# Patient Record
Sex: Male | Born: 2000 | Race: White | Hispanic: No | Marital: Single | Smoking: Never smoker
Health system: Southern US, Community
[De-identification: ages and names within clinical notes are randomized; demographics above are authoritative.]

## PROBLEM LIST (undated history)

## (undated) DIAGNOSIS — L309 Dermatitis, unspecified: Secondary | ICD-10-CM

## (undated) DIAGNOSIS — R569 Unspecified convulsions: Secondary | ICD-10-CM

## (undated) DIAGNOSIS — R5600 Simple febrile convulsions: Secondary | ICD-10-CM

## (undated) DIAGNOSIS — F909 Attention-deficit hyperactivity disorder, unspecified type: Secondary | ICD-10-CM

## (undated) DIAGNOSIS — J302 Other seasonal allergic rhinitis: Secondary | ICD-10-CM

## (undated) HISTORY — PX: OTHER SURGICAL HISTORY: SHX169

---
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
## 2022-07-28 ENCOUNTER — Other Ambulatory Visit (INDEPENDENT_AMBULATORY_CARE_PROVIDER_SITE_OTHER): Payer: Self-pay

## 2022-07-28 ENCOUNTER — Other Ambulatory Visit (HOSPITAL_BASED_OUTPATIENT_CLINIC_OR_DEPARTMENT_OTHER): Payer: Self-pay | Admitting: Family Medicine

## 2022-07-31 ENCOUNTER — Other Ambulatory Visit (HOSPITAL_COMMUNITY): Payer: Self-pay

## 2022-07-31 ENCOUNTER — Other Ambulatory Visit: Payer: Self-pay

## 2022-07-31 MED ORDER — TRIAMCINOLONE ACETONIDE 0.1 % EX CREA
1.0000 | TOPICAL_CREAM | Freq: Two times a day (BID) | CUTANEOUS | 1 refills | Status: DC
  Filled 2022-07-31 (×2): qty 30, 30d supply, fill #0
  Filled 2022-08-31: qty 30, 30d supply, fill #1

## 2022-08-08 ENCOUNTER — Ambulatory Visit (INDEPENDENT_AMBULATORY_CARE_PROVIDER_SITE_OTHER): Payer: BC Managed Care – PPO | Admitting: Family Medicine

## 2022-08-08 ENCOUNTER — Other Ambulatory Visit (HOSPITAL_COMMUNITY): Payer: Self-pay

## 2022-08-08 ENCOUNTER — Encounter (HOSPITAL_BASED_OUTPATIENT_CLINIC_OR_DEPARTMENT_OTHER): Payer: Self-pay | Admitting: Family Medicine

## 2022-08-08 ENCOUNTER — Other Ambulatory Visit: Payer: Self-pay

## 2022-08-08 VITALS — BP 138/81 | HR 89 | Ht 68.98 in | Wt 154.1 lb

## 2022-08-08 DIAGNOSIS — G40309 Generalized idiopathic epilepsy and epileptic syndromes, not intractable, without status epilepticus: Secondary | ICD-10-CM | POA: Diagnosis not present

## 2022-08-08 MED ORDER — CARBAMAZEPINE 200 MG PO TABS
200.0000 mg | ORAL_TABLET | Freq: Two times a day (BID) | ORAL | 0 refills | Status: DC
  Filled 2022-08-08: qty 60, 30d supply, fill #0

## 2022-08-08 NOTE — Assessment & Plan Note (Addendum)
Patient with ongoing seizure disorder, has been off of Tegretol 200 mg regularly since June 2023.  Witnessed seizure by father while at home on July 28, 2022, at that time parents discovered that patient has not been taking his Tegretol as prescribed.  Since this witnessed seizure he has been taking Tegretol 200 mg twice per day.  Ambulatory referral for neurology placed today, Tegretol 200 mg twice daily sent to pharmacy.  Referral specialist able to secure an appointment with [facility] neurology this Thursday, February 8 at 10:15 AM for further evaluation and treatment.  Patient and mother aware.  Follow-up with PCP in 3 months.

## 2022-08-08 NOTE — Progress Notes (Signed)
Established Patient Office Visit  Subjective   Patient ID: Matthew Duncan, male    DOB: [birth year]  Age: 22 y.o. MRN: [id number]  Chief Complaint  Patient presents with   Seizures    Pt here for having episodes of epilepsy, pt stopped taking his medication for having seizures need a renewal for Tegretol   Medication Refill    HPI  Seizure disorder was followed by Dr. Gaynell Face last in 2022 before he retired. They could never get an appointment with one of the partners in the practice, started coming to PCP for management. Patient got a job and wanted to focus on doing the job right and he stopped taking the tegretol regularly since June 2023 and January 2024.  Patient was telling parents he was taking his medications when he was not. Has had one seizure on 07/28/22 at home witnessed by father.  Since that time he has been taking Tegretol 200 mg BID currently. Has  not had any more seizures since being on medication regularly. Mother reports he did not go to hospital at that time as he recovered quickly.  Tolerates medication well without reported side effects.  Wants neurology referral today.  Discussed getting tegretol level today, has only been on medication since 07/28/22, will defer to neurology.   Review of Systems  Constitutional:  Negative for chills and fever.  Respiratory:  Negative for shortness of breath.   Cardiovascular:  Negative for chest pain.  Gastrointestinal:  Negative for abdominal pain, nausea and vomiting.  Neurological:  Negative for dizziness, weakness and headaches.      Objective:     BP 138/81 (BP Location: Right Arm, Patient Position: Sitting, Cuff Size: Normal)   Pulse 89   Ht 5' 8.98" (1.752 m)   Wt 154 lb 1.6 oz (69.9 kg)   SpO2 100%   BMI 22.77 kg/m  BP Readings from Last 3 Encounters:  08/08/22 138/81  11/04/21 116/74  05/25/21 126/70      Physical Exam Vitals and nursing note reviewed.  Constitutional:      General: He is not in acute  distress.    Appearance: Normal appearance.  Cardiovascular:     Rate and Rhythm: Normal rate and regular rhythm.     Heart sounds: Normal heart sounds.  Pulmonary:     Effort: Pulmonary effort is normal.     Breath sounds: Normal breath sounds.  Skin:    General: Skin is warm and dry.     Capillary Refill: Capillary refill takes less than 2 seconds.  Neurological:     General: No focal deficit present.     Mental Status: He is alert. Mental status is at baseline.  Psychiatric:        Mood and Affect: Mood normal.        Behavior: Behavior normal.        Thought Content: Thought content normal.        Judgment: Judgment normal.     No results found for any visits on 08/08/22.    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  Witnessed seizure by  father while at home on July 28, 2022, at that time parents discovered that patient has not been taking his Tegretol as prescribed.  Since this witnessed seizure he has been taking Tegretol 200 mg twice per day.  Ambulatory referral for neurology placed today, Tegretol 200 mg twice daily sent to pharmacy.  Referral specialist able to secure an appointment with [facility] neurology this Thursday, February 8 at 10:15 AM for further evaluation and treatment.  Patient and mother aware.  Follow-up with PCP in 3 months.      Relevant Medications   carbamazepine (TEGRETOL) 200 MG tablet   Other Relevant Orders   Ambulatory referral to Neurology  Agrees with plan of care discussed.  Questions answered.   Return in about 3 months (around 11/06/2022) for seizure disorder.    Chalmers Guest, FNP

## 2022-08-10 ENCOUNTER — Ambulatory Visit (INDEPENDENT_AMBULATORY_CARE_PROVIDER_SITE_OTHER): Payer: BC Managed Care – PPO | Admitting: Neurology

## 2022-08-10 ENCOUNTER — Other Ambulatory Visit (HOSPITAL_COMMUNITY): Payer: Self-pay

## 2022-08-10 ENCOUNTER — Other Ambulatory Visit: Payer: Self-pay

## 2022-08-10 ENCOUNTER — Encounter: Payer: Self-pay | Admitting: Neurology

## 2022-08-10 VITALS — BP 125/78 | HR 88 | Ht 68.58 in | Wt 155.0 lb

## 2022-08-10 DIAGNOSIS — G40309 Generalized idiopathic epilepsy and epileptic syndromes, not intractable, without status epilepticus: Secondary | ICD-10-CM

## 2022-08-10 DIAGNOSIS — Z5181 Encounter for therapeutic drug level monitoring: Secondary | ICD-10-CM | POA: Diagnosis not present

## 2022-08-10 MED ORDER — CARBAMAZEPINE 200 MG PO TABS
200.0000 mg | ORAL_TABLET | Freq: Two times a day (BID) | ORAL | 3 refills | Status: DC
  Filled 2022-08-10: qty 180, 90d supply, fill #0
  Filled 2022-09-25: qty 60, 30d supply, fill #0
  Filled 2022-10-02: qty 180, 90d supply, fill #0
  Filled 2022-10-02: qty 60, 30d supply, fill #0
  Filled 2022-10-03 – 2022-10-04 (×2): qty 180, 90d supply, fill #0

## 2022-08-10 NOTE — Progress Notes (Signed)
[facility]  PATIENT: Matthew Duncan DOB: [birth year]  REQUESTING CLINICIAN: Chalmers Guest, FNP HISTORY FROM: Patient and mother  REASON FOR VISIT: Epilepsy, here to establish care   HISTORICAL  CHIEF COMPLAINT:  Chief Complaint  Patient presents with   New Patient (Initial Visit)    Rm 13 with mom here for consult on seizures- Pt reports 07/28/2022 he had a seizure due to not taking his meds since June of 2023. He back on Tegretol 200 mg bid. Mom reports she would like discuss if this med dosage is appropriate.     HISTORY OF PRESENT ILLNESS:  This is a 22 year old gentleman with past medical history ADHD, seizure disorder who is presenting to establish care.  History mainly obtained by mother.  Per mom seizure started at the age of 45 days old.  She was nursing [name] and his whole body went limp, then he started having some shaking.  He was admitted to the hospital had extensive workup but they could not figure out why he was having seizures.  He was having seizures until 20-month-old then seizure got better and the meds were weaned at the age of 22.  At the age of 22 he did have a febrile seizure they did not restart him on medication but at the age of 22 he did have a seizure at school.  Then he started seeing Dr. Gaynell Face and put on Tegretol.  While he is on Tegretol he has been doing well but every time that he weaned the Tegretol he will have a breakthrough seizure.  This summer while going for summer camp he stopped the medication and did not tell his parents until January 26 when he did have a seizure in his sleep.  At that time, he was restarted back on Tegretol 200 mg twice daily.  With the Tegretol, he denies any side effect from the medication.  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    Handedness: Right   Onset: Since the age of 20 days old   Seizure Type: Generalized  convulsion  Current frequency: Last episode in Jan 26  Any injuries from seizures: Tongue biting   Seizure risk factors: None reported   Previous ASMs: Phenobarb, Tegetrol, Dilantin   Currenty ASMs: Carbamazepine 200 mg twice daily   ASMs side effects: Denies   Brain Images: reported as normal   Previous EEGs: Normal routine    OTHER MEDICAL CONDITIONS: ADHD   REVIEW OF SYSTEMS: Full 14 system review of systems performed and negative with exception of: As noted in the HPI   ALLERGIES: No Known Allergies  HOME MEDICATIONS: Outpatient Medications Prior to Visit  Medication Sig Dispense Refill   triamcinolone cream (KENALOG) 0.1 % Apply 1 Application topically 2 (two) times daily. 30 g 1   carbamazepine (TEGRETOL) 200 MG tablet Take 1 tablet (200 mg total) by mouth 2 (two) times daily. 60 tablet 0   No facility-administered medications prior to visit.    PAST MEDICAL HISTORY: Past Medical History:  Diagnosis Date   ADHD (attention deficit hyperactivity disorder)    Asthma    Eczema    Febrile seizure ([place])    Seasonal allergies    Seizure ([place])    Seizures ([place])     PAST SURGICAL HISTORY: Past Surgical History:  Procedure Laterality Date   DENTAL SURGERY  2010   OTHER SURGICAL HISTORY  2004  Broviac Catheter insertion and removal when he was an infant    FAMILY HISTORY: Family History  Problem Relation Age of Onset   Diabetes Maternal Grandfather    Heart Problems Maternal Grandfather        Heart Disease   Seizures Other        Maternal 2nd Cousin    SOCIAL HISTORY: Social History   Socioeconomic History   Marital status: Single    Spouse name: Not on file   Number of children: Not on file   Years of education: Not on file   Highest education level: Not on file  Occupational History   Not on file  Tobacco Use   Smoking status: Never   Smokeless tobacco: Never  Substance and Sexual Activity   Alcohol use: No   Drug use: No   Sexual  activity: Never  Other Topics Concern   Not on file  Social History Narrative   Hanna will attend [employer] in the fall   He lives with both parents and his brother.   He enjoys reading, playing videogames, chess   Social Determinants of Health   Financial Resource Strain: Not on file  Food Insecurity: Not on file  Transportation Needs: Not on file  Physical Activity: Not on file  Stress: Not on file  Social Connections: Not on file  Intimate Partner Violence: Not on file    PHYSICAL EXAM  GENERAL EXAM/CONSTITUTIONAL: Vitals:  Vitals:   08/10/22 1013  BP: 125/78  Pulse: 88  Weight: 155 lb (70.3 kg)  Height: 5' 8.58" (1.742 m)   Body mass index is 23.17 kg/m. Wt Readings from Last 3 Encounters:  08/10/22 155 lb (70.3 kg) (51 %, Z= 0.02)*  08/08/22 154 lb 1.6 oz (69.9 kg) (49 %, Z= -0.01)*  11/04/21 149 lb 3.2 oz (67.7 kg) (46 %, Z= -0.10)*   * Growth percentiles are based on CDC (Boys, 2-20 Years) data.   Patient is in no distress; well developed, nourished and groomed; neck is supple  EYES: Visual fields full to confrontation, Extraocular movements intacts,  No results found.  MUSCULOSKELETAL: Gait, strength, tone, movements noted in Neurologic exam below  NEUROLOGIC: MENTAL STATUS:      No data to display         awake, alert, oriented to person, place and time recent and remote memory intact normal attention and concentration language fluent, comprehension intact, naming intact fund of knowledge appropriate  CRANIAL NERVE:  2nd, 3rd, 4th, 6th - Visual fields full to confrontation, extraocular muscles intact, no nystagmus 5th - facial sensation symmetric 7th - facial strength symmetric 8th - hearing intact 9th - palate elevates symmetrically, uvula midline 11th - shoulder shrug symmetric 12th - tongue protrusion midline  MOTOR:  normal bulk and tone, full strength in the BUE, BLE  SENSORY:  normal and symmetric to light touch  COORDINATION:   finger-nose-finger, fine finger movements normal  REFLEXES:  deep tendon reflexes present and symmetric  GAIT/STATION:  normal   DIAGNOSTIC DATA (LABS, IMAGING, TESTING) - I reviewed patient records, labs, notes, testing and imaging myself where available.  Lab Results  Component Value Date   WBC 4.1 (L) 10/22/2019   HGB 16.5 10/22/2019   HCT 48.7 10/22/2019   MCV 92.9 10/22/2019   PLT 215 10/22/2019      Component Value Date/Time   NA 138 08/20/2019 1014   K 4.2 08/20/2019 1014   CL 104 08/20/2019 1014   CO2 24 08/20/2019 1014  GLUCOSE 92 08/20/2019 1014   BUN 10 08/20/2019 1014   CREATININE 0.70 08/20/2019 1014   CALCIUM 9.5 08/20/2019 1014   PROT 7.1 08/20/2019 1014   ALBUMIN 4.3 08/20/2019 1014   AST 22 08/20/2019 1014   ALT 12 10/22/2019 0852   ALKPHOS 84 08/20/2019 1014   BILITOT 0.9 08/20/2019 1014   GFRNONAA NOT CALCULATED 08/20/2019 1014   GFRAA NOT CALCULATED 08/20/2019 1014   No results found for: "CHOL", "HDL", "LDLCALC", "LDLDIRECT", "TRIG" No results found for: "HGBA1C" No results found for: "VITAMINB12" No results found for: "TSH"   Routine EEG 09/14/2015 This EEG is normal during awake and sleep states. Please note that normal EEG does not exclude epilepsy, clinical correlation is indicated.     ASSESSMENT AND PLAN  22 y.o. year old male  with ADHD and seizure disorder who is presenting to establish care.  His seizure has been well-managed with carbamazepine 200 mg twice daily but every time that he is weaned from the medication he will have a breakthrough seizure.  At this time I will continue him on carbamazepine 200 mg twice daily, will check level with BMP and vitamin D level.  I will also obtain a routine EEG, the last one was in 2017.  We discussed driving restriction for next 6 months and he voiced understanding.  He reports he has difficulty completing some of his classes on campus due to not being able to drive.  I have provided him a  letter and hopefully he can withdraw those classes without having any penalties.  I will see him in 6 months for follow-up or sooner if worse. We also discussed need of being adherent with the medication.    Discussed Patients with epilepsy have a small risk of sudden unexpected death, a condition referred to as sudden unexpected death in epilepsy (SUDEP). SUDEP is defined specifically as the sudden, unexpected, witnessed or unwitnessed, nontraumatic and nondrowning death in patients with epilepsy with or without evidence for a seizure, and excluding documented status epilepticus, in which post mortem examination does not reveal a structural or toxicologic cause for death  .   1. Therapeutic drug monitoring   2. 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     Per [facility] statutes, patients with seizures are not allowed to drive until they have been seizure-free for six months.  Other recommendations include using caution when using heavy equipment or power tools. Avoid working on ladders or at heights. Take showers instead of baths.  Do not swim alone.  Ensure the water temperature is not too high on the home water heater. Do not go swimming alone. Do not lock yourself in a room alone (i.e. bathroom). When caring for infants or small children, sit down when holding, feeding, or changing them to minimize risk of injury to the child in the event you have a seizure. Maintain good sleep hygiene. Avoid alcohol.  Also recommend adequate sleep, hydration, good diet and minimize stress.   During the Seizure  - First, ensure adequate ventilation and place patients on the floor on their left side  Loosen clothing around the neck and ensure the airway is patent. If  the patient is clenching the teeth, do not force the mouth open with any object  as this can cause severe damage - Remove all items from the surrounding that can be hazardous. The patient may be oblivious to what's happening and may not even know what he or she is doing. If the patient is confused and wandering, either gently guide him/her away and block access to outside areas - Reassure the individual and be comforting - Call 911. In most cases, the seizure ends before EMS arrives. However, there are cases when seizures may last over 3 to 5 minutes. Or the individual may have developed breathing difficulties or severe injuries. If a pregnant patient or a person with diabetes develops a seizure, it is prudent to call an ambulance. - Finally, if the patient does not regain full consciousness, then call EMS. Most patients will remain confused for about 45 to 90 minutes after a seizure, so you must use judgment in calling for help. - Avoid restraints but make sure the patient is in a bed with padded side rails - Place the individual in a lateral position with the neck slightly flexed; this will help the saliva drain from the mouth and prevent the tongue from falling backward - Remove all nearby furniture and other hazards from the area - Provide verbal assurance as the individual is regaining consciousness - Provide the patient with privacy if possible - Call for help and start treatment as ordered by the caregiver   After the Seizure (Postictal Stage)  After a seizure, most patients experience confusion, fatigue, muscle pain and/or a headache. Thus, one should permit the individual to sleep. For the next few days, reassurance is essential. Being calm and helping reorient the person is also of importance.  Most seizures are painless and end spontaneously. Seizures are not harmful to others but can lead to complications such as stress on the lungs, brain and the heart. Individuals with prior lung  problems may develop labored breathing and respiratory distress.     Orders Placed This Encounter  Procedures   Carbamazepine level, total   Basic Metabolic Panel   Vitamin D, 25-hydroxy   EEG adult    Meds ordered this encounter  Medications   carbamazepine (TEGRETOL) 200 MG tablet    Sig: Take 1 tablet (200 mg total) by mouth 2 (two) times daily.    Dispense:  180 tablet    Refill:  3    Return in about 6 months (around 02/08/2023).    Alric Ran, MD 08/10/2022, 2:20 PM  [facility] [address] [place],  [address] [phone]

## 2022-08-10 NOTE — Patient Instructions (Addendum)
Continue with carbamazepine 200 mg twice daily Will check a carbamazepine level today with BMP and vitamin D level Routine EEG Driving restriction for next 55-months Follow-up in 6 months or sooner if worse.

## 2022-08-11 LAB — CARBAMAZEPINE LEVEL, TOTAL: Carbamazepine (Tegretol), S: 8.9 ug/mL (ref 4.0–12.0)

## 2022-08-11 LAB — BASIC METABOLIC PANEL
BUN/Creatinine Ratio: 13 (ref 9–20)
BUN: 11 mg/dL (ref 6–20)
CO2: 24 mmol/L (ref 20–29)
Calcium: 9.6 mg/dL (ref 8.7–10.2)
Chloride: 99 mmol/L (ref 96–106)
Creatinine, Ser: 0.84 mg/dL (ref 0.76–1.27)
Glucose: 98 mg/dL (ref 70–99)
Potassium: 4.2 mmol/L (ref 3.5–5.2)
Sodium: 140 mmol/L (ref 134–144)
eGFR: 129 mL/min/{1.73_m2} (ref >59)

## 2022-08-11 LAB — VITAMIN D 25 HYDROXY (VIT D DEFICIENCY, FRACTURES): Vit D, 25-Hydroxy: 20.6 ng/mL — ABNORMAL LOW (ref 30.0–100.0)

## 2022-08-18 ENCOUNTER — Encounter: Payer: Self-pay | Admitting: Neurology

## 2022-08-23 ENCOUNTER — Other Ambulatory Visit (HOSPITAL_COMMUNITY): Payer: Self-pay

## 2022-08-24 ENCOUNTER — Telehealth (HOSPITAL_BASED_OUTPATIENT_CLINIC_OR_DEPARTMENT_OTHER): Payer: Self-pay | Admitting: Family Medicine

## 2022-08-24 NOTE — Telephone Encounter (Signed)
Pt advised NO to the Flu Vaccine

## 2022-08-31 ENCOUNTER — Other Ambulatory Visit: Payer: Self-pay

## 2022-08-31 ENCOUNTER — Ambulatory Visit (INDEPENDENT_AMBULATORY_CARE_PROVIDER_SITE_OTHER): Payer: BC Managed Care – PPO | Admitting: Neurology

## 2022-08-31 DIAGNOSIS — G40309 Generalized idiopathic epilepsy and epileptic syndromes, not intractable, without status epilepticus: Secondary | ICD-10-CM

## 2022-08-31 NOTE — Procedures (Signed)
    History:  22 year old man with epilepsy   EEG classification: Awake and drowsy  Description of the recording: The background rhythms of this recording consists of a fairly well modulated medium amplitude alpha rhythm of 9 Hz that is reactive to eye opening and closure. Present in the anterior head region is a 15-20 Hz beta activity. Photic stimulation was performed, did not show any abnormalities. Hyperventilation was also performed, did not show any abnormalities. Drowsiness was manifested by background fragmentation. No abnormal epileptiform discharges seen during this recording. There was no focal slowing. There were no electrographic seizure identified.   Abnormality: None   Impression: This is a normal EEG recorded while drowsy and awake. No evidence of interictal epileptiform discharges. Normal EEGs, however, do not rule out epilepsy.    Alric Ran, MD [facility]

## 2022-09-07 NOTE — Telephone Encounter (Signed)
I dont show this is updated

## 2022-09-25 ENCOUNTER — Other Ambulatory Visit (HOSPITAL_COMMUNITY): Payer: Self-pay

## 2022-10-02 ENCOUNTER — Other Ambulatory Visit: Payer: Self-pay

## 2022-10-02 ENCOUNTER — Other Ambulatory Visit (HOSPITAL_COMMUNITY): Payer: Self-pay

## 2022-10-03 ENCOUNTER — Other Ambulatory Visit (HOSPITAL_COMMUNITY): Payer: Self-pay

## 2022-10-04 ENCOUNTER — Other Ambulatory Visit (HOSPITAL_COMMUNITY): Payer: Self-pay

## 2022-10-04 ENCOUNTER — Other Ambulatory Visit: Payer: Self-pay

## 2022-10-05 ENCOUNTER — Other Ambulatory Visit (HOSPITAL_COMMUNITY): Payer: Self-pay

## 2022-11-08 ENCOUNTER — Ambulatory Visit (HOSPITAL_BASED_OUTPATIENT_CLINIC_OR_DEPARTMENT_OTHER): Payer: BC Managed Care – PPO | Admitting: Family Medicine

## 2022-11-14 ENCOUNTER — Encounter (HOSPITAL_BASED_OUTPATIENT_CLINIC_OR_DEPARTMENT_OTHER): Payer: Self-pay | Admitting: Family Medicine

## 2022-11-14 ENCOUNTER — Ambulatory Visit (INDEPENDENT_AMBULATORY_CARE_PROVIDER_SITE_OTHER): Payer: BC Managed Care – PPO | Admitting: Family Medicine

## 2022-11-14 VITALS — BP 137/97 | HR 64 | Temp 97.6°F | Ht 68.6 in | Wt 158.0 lb

## 2022-11-14 DIAGNOSIS — F840 Autistic disorder: Secondary | ICD-10-CM | POA: Diagnosis not present

## 2022-11-14 DIAGNOSIS — G40309 Generalized idiopathic epilepsy and epileptic syndromes, not intractable, without status epilepticus: Secondary | ICD-10-CM | POA: Diagnosis not present

## 2022-11-14 DIAGNOSIS — F902 Attention-deficit hyperactivity disorder, combined type: Secondary | ICD-10-CM

## 2022-11-14 NOTE — Progress Notes (Unsigned)
    Procedures performed today:    None.  Independent interpretation of notes and tests performed by another provider:   None.  Brief History, Exam, Impression, and Recommendations:    BP (!) 137/97 (BP Location: Left Arm, Patient Position: Sitting, Cuff Size: Normal)   Pulse 64   Temp 97.6 F (36.4 C) (Oral)   Ht 5' 8.6" (1.742 m)   Wt 158 lb (71.7 kg)   SpO2 100%   BMI 23.61 kg/m   No problem-specific Assessment & Plan notes found for this encounter.  No follow-ups on file.   ___________________________________________ Dorin Stooksbury de [place], MD, ABFM, [facility] Primary Care and Sports Medicine [facility]

## 2022-11-20 NOTE — Assessment & Plan Note (Signed)
Patient with history of ADHD.  He has not utilize medication for some time.  He does feel that symptoms have been more impactful regarding day-to-day life as well as school.  He would be interested in pursuing potential pharmacotherapy.  Feel that given his comorbidities, patient would be best served having further evaluation with behavioral health specialist for further evaluation and treatment recommendations.  Patient is in agreement.  Referral placed today.

## 2022-11-20 NOTE — Assessment & Plan Note (Signed)
Patient did sustain seizure when being off of Tegretol.  He continues to follow with neurology and continues with Tegretol as prescribed.  Has not had any further seizure activity since episode in January.  He is aware of the need to remain on medication, denies any issues or concerns related to medication at this time.  He is also aware of state driving laws related to seizure activity No changes made today to medication.  Recommend continued follow-up with neurologist

## 2023-02-13 ENCOUNTER — Ambulatory Visit (INDEPENDENT_AMBULATORY_CARE_PROVIDER_SITE_OTHER): Payer: BC Managed Care – PPO | Admitting: Neurology

## 2023-02-13 ENCOUNTER — Encounter: Payer: Self-pay | Admitting: Neurology

## 2023-02-13 VITALS — BP 128/76 | HR 88 | Ht 69.0 in | Wt 151.5 lb

## 2023-02-13 DIAGNOSIS — G40309 Generalized idiopathic epilepsy and epileptic syndromes, not intractable, without status epilepticus: Secondary | ICD-10-CM

## 2023-02-13 NOTE — Progress Notes (Signed)
[facility]  PATIENT: Matthew Duncan DOB: 2001/02/10  REQUESTING CLINICIAN: de Peru, Raymond J, MD HISTORY FROM: Patient and mother  REASON FOR VISIT: Epilepsy, here to establish care   HISTORICAL  CHIEF COMPLAINT:  Chief Complaint  Patient presents with   Seizures    Rm13, mom present  VF:IEPP 1 was 07/29/22, he wants to be able to drive and has started a new job at [employer]     INTERVAL HISTORY 02/13/2023:  Patient presents today for follow-up, he is accompanied by her mother.  Last visit was in February, at that time, plan was to continue patient on Tegretol 200 mg twice daily.  He continues to do well, his routine EEG was negative for any acute abnormality.  He reports having a job at [employer], overall he is doing well.  He is still in school.  He is ready to go back to driving.  No other complaints, no other concerns.   HISTORY OF PRESENT ILLNESS:  This is a 22 year old gentleman with past medical history ADHD, seizure disorder who is presenting to establish care.  History mainly obtained by mother.  Per mom seizure started at the age of 4 days old.  She was nursing [name] and his whole body went limp, then he started having some shaking.  He was admitted to the hospital had extensive workup but they could not figure out why he was having seizures.  He was having seizures until 64-month-old then seizure got better and the meds were weaned at the age of 4.  At the age of 7 he did have a febrile seizure they did not restart him on medication but at the age of 74 he did have a seizure at school.  Then he started seeing Dr. Sharene Skeans and put on Tegretol.  While he is on Tegretol he has been doing well but every time that he weaned the Tegretol he will have a breakthrough seizure.  This summer while going for summer camp he stopped the medication and did not tell his parents until January 26 when he did have a seizure in his sleep.  At that time, he was restarted  back on Tegretol 200 mg twice daily.  With the Tegretol, he denies any side effect from the medication.  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    Handedness: Right   Onset: Since the age of 71 days old   Seizure Type: Generalized convulsion  Current frequency: Last episode in Jul 28 2022  Any injuries from seizures: Tongue biting   Seizure risk factors: None reported   Previous ASMs: Phenobarb, Tegetrol, Dilantin   Currenty ASMs: Carbamazepine 200 mg twice daily   ASMs side effects: Denies   Brain Images: reported as normal   Previous EEGs: Normal routine    OTHER MEDICAL CONDITIONS: ADHD   REVIEW OF SYSTEMS: Full 14 system review of systems performed and negative with exception of: As noted in the HPI   ALLERGIES: No Known Allergies  HOME MEDICATIONS: Outpatient Medications Prior to Visit  Medication Sig Dispense Refill   carbamazepine (TEGRETOL) 200 MG tablet Take 1 tablet (200 mg total) by mouth 2 (two) times daily. 180 tablet 3   triamcinolone cream (KENALOG) 0.1 % Apply 1 Application topically 2 (two) times daily. 30 g 1   No facility-administered medications prior to visit.    PAST MEDICAL HISTORY: Past Medical History:  Diagnosis  Date   ADHD (attention deficit hyperactivity disorder)    Asthma    Eczema    Febrile seizure (HCC)    Seasonal allergies    Seizure (HCC)    Seizures (HCC)     PAST SURGICAL HISTORY: Past Surgical History:  Procedure Laterality Date   DENTAL SURGERY  2010   OTHER SURGICAL HISTORY  2004   Broviac Catheter insertion and removal when he was an infant    FAMILY HISTORY: Family History  Problem Relation Age of Onset   Diabetes Maternal Grandfather    Heart Problems Maternal Grandfather        Heart Disease   Seizures Other        Maternal 2nd Cousin    SOCIAL HISTORY: Social History   Socioeconomic History   Marital status:  Single    Spouse name: Not on file   Number of children: 0   Years of education: Not on file   Highest education level: Some college, no degree  Occupational History   Not on file  Tobacco Use   Smoking status: Never   Smokeless tobacco: Never  Vaping Use   Vaping status: Never Used  Substance and Sexual Activity   Alcohol use: No   Drug use: No   Sexual activity: Never  Other Topics Concern   Not on file  Social History Narrative   Nakia will attend [employer] in the fall   He lives with both parents and his brother.   He enjoys reading, playing videogames, chess   Social Determinants of Health   Financial Resource Strain: Not on file  Food Insecurity: Not on file  Transportation Needs: Not on file  Physical Activity: Not on file  Stress: Not on file  Social Connections: Not on file  Intimate Partner Violence: Not on file    PHYSICAL EXAM  GENERAL EXAM/CONSTITUTIONAL: Vitals:  Vitals:   02/13/23 1447  BP: 128/76  Pulse: 88  Weight: 151 lb 8 oz (68.7 kg)  Height: 5\' 9"  (1.753 m)   Body mass index is 22.37 kg/m. Wt Readings from Last 3 Encounters:  02/13/23 151 lb 8 oz (68.7 kg)  11/14/22 158 lb (71.7 kg) (54%, Z= 0.11)*  08/10/22 155 lb (70.3 kg) (51%, Z= 0.02)*   * Growth percentiles are based on CDC (Boys, 2-20 Years) data.   Patient is in no distress; well developed, nourished and groomed; neck is supple  MUSCULOSKELETAL: Gait, strength, tone, movements noted in Neurologic exam below  NEUROLOGIC: MENTAL STATUS:      No data to display         awake, alert, oriented to person, place and time recent and remote memory intact normal attention and concentration language fluent, comprehension intact, naming intact fund of knowledge appropriate  CRANIAL NERVE:  2nd, 3rd, 4th, 6th - Visual fields full to confrontation, extraocular muscles intact, no nystagmus 5th - facial sensation symmetric 7th - facial strength symmetric 8th - hearing intact 9th -  palate elevates symmetrically, uvula midline 11th - shoulder shrug symmetric 12th - tongue protrusion midline  MOTOR:  normal bulk and tone, full strength in the BUE, BLE  SENSORY:  normal and symmetric to light touch  COORDINATION:  finger-nose-finger, fine finger movements normal  REFLEXES:  deep tendon reflexes present and symmetric  GAIT/STATION:  normal   DIAGNOSTIC DATA (LABS, IMAGING, TESTING) - I reviewed patient records, labs, notes, testing and imaging myself where available.  Lab Results  Component Value Date   WBC 4.1 (L)  10/22/2019   HGB 16.5 10/22/2019   HCT 48.7 10/22/2019   MCV 92.9 10/22/2019   PLT 215 10/22/2019      Component Value Date/Time   NA 140 08/10/2022 1047   K 4.2 08/10/2022 1047   CL 99 08/10/2022 1047   CO2 24 08/10/2022 1047   GLUCOSE 98 08/10/2022 1047   GLUCOSE 92 08/20/2019 1014   BUN 11 08/10/2022 1047   CREATININE 0.84 08/10/2022 1047   CALCIUM 9.6 08/10/2022 1047   PROT 7.1 08/20/2019 1014   ALBUMIN 4.3 08/20/2019 1014   AST 22 08/20/2019 1014   ALT 12 10/22/2019 0852   ALKPHOS 84 08/20/2019 1014   BILITOT 0.9 08/20/2019 1014   GFRNONAA NOT CALCULATED 08/20/2019 1014   GFRAA NOT CALCULATED 08/20/2019 1014   No results found for: "CHOL", "HDL", "LDLCALC", "LDLDIRECT", "TRIG" No results found for: "HGBA1C" No results found for: "VITAMINB12" No results found for: "TSH"   Routine EEG 09/14/2015 This EEG is normal during awake and sleep states. Please note that normal EEG does not exclude epilepsy, clinical correlation is indicated.   Routine EEG 08/31/2022 Normal     ASSESSMENT AND PLAN  22 y.o. year old male  with ADHD and seizure disorder who is presenting  for follow up.  Last seizure was in January.  He continues to do well on carbamazepine 200 mg twice daily, last level was 8.9.  Denies any side effect from the medication.  His most recent EEG was also negative.  Plan will be to continue patient on carbamazepine  200 mg twice daily.  He understands to contact us if he does have another seizure.  When he comes to his ADD, I did advise him to follow-up with his PCP for referral to psychiatry.  I also gave them the information regarding [facility] but informed them they are cash only.  They will probably get a referral from the PCP.  Will send refill and patient will follow-up in 1 year or sooner if worse.   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   Per [place] [facility] [place] [facility] statutes, patients with seizures are not allowed to drive until they have been seizure-free for six months.  Other recommendations include using caution when using heavy equipment or power tools. Avoid working on ladders or at heights. Take showers instead of baths.  Do not swim alone.  Ensure the water temperature is not too high on the home water heater. Do not go swimming alone. Do not lock yourself in a room alone (i.e. bathroom). When caring for infants or small children, sit down when holding, feeding, or changing them to minimize risk of injury to the child in the event you have a seizure. Maintain good sleep hygiene. Avoid alcohol.  Also recommend adequate sleep, hydration, good diet and minimize stress.   During the Seizure  - First, ensure adequate ventilation and place patients on the floor on their left side  Loosen clothing around the neck and ensure the airway is patent. If the patient is clenching the teeth, do not force the mouth open with any object as this can cause severe damage - Remove all items from the surrounding that can be hazardous. The patient may be oblivious to what's happening and may not even know what  he or she is doing. If the patient is confused and wandering, either  gently guide him/her away and block access to outside areas - Reassure the individual and be comforting - Call 911. In most cases, the seizure ends before EMS arrives. However, there are cases when seizures may last over 3 to 5 minutes. Or the individual may have developed breathing difficulties or severe injuries. If a pregnant patient or a person with diabetes develops a seizure, it is prudent to call an ambulance. - Finally, if the patient does not regain full consciousness, then call EMS. Most patients will remain confused for about 45 to 90 minutes after a seizure, so you must use judgment in calling for help. - Avoid restraints but make sure the patient is in a bed with padded side rails - Place the individual in a lateral position with the neck slightly flexed; this will help the saliva drain from the mouth and prevent the tongue from falling backward - Remove all nearby furniture and other hazards from the area - Provide verbal assurance as the individual is regaining consciousness - Provide the patient with privacy if possible - Call for help and start treatment as ordered by the caregiver   After the Seizure (Postictal Stage)  After a seizure, most patients experience confusion, fatigue, muscle pain and/or a headache. Thus, one should permit the individual to sleep. For the next few days, reassurance is essential. Being calm and helping reorient the person is also of importance.  Most seizures are painless and end spontaneously. Seizures are not harmful to others but can lead to complications such as stress on the lungs, brain and the heart. Individuals with prior lung problems may develop labored breathing and respiratory distress.     No orders of the defined types were placed in this encounter.   No orders of the defined types were placed in this encounter.   Return in about 1 year (around 02/13/2024).    Windell Norfolk, MD 02/13/2023, 6:05 PM  [facility] [address] [place], [place] [address] [phone]

## 2023-02-13 NOTE — Patient Instructions (Signed)
Continue with carbamazepine 200 mg twice daily Continue with your other medications Follow-up with PCP regarding a referral to psychiatry for evaluation of ADD Return in 1 year or sooner if worse.

## 2023-02-19 ENCOUNTER — Ambulatory Visit: Payer: BC Managed Care – PPO | Admitting: Neurology

## 2023-03-16 ENCOUNTER — Ambulatory Visit (INDEPENDENT_AMBULATORY_CARE_PROVIDER_SITE_OTHER): Payer: Self-pay | Admitting: Podiatry

## 2023-03-16 DIAGNOSIS — L600 Ingrowing nail: Secondary | ICD-10-CM

## 2023-03-16 MED ORDER — CEPHALEXIN 500 MG PO CAPS: 500.0000 mg | ORAL_CAPSULE | Freq: Three times a day (TID) | ORAL | 0 refills | Status: DC

## 2023-03-16 NOTE — Progress Notes (Unsigned)
Subjective:   Patient ID: Matthew Duncan, male   DOB: 22 y.o.   MRN: [id number]   HPI Chief Complaint  Patient presents with   Ingrown Toenail    Ingrown toenail to left hallux lateral border. He is not taking any antibiotics at this time.    22 year old male presents the office today with concerns of ingrown toenail left lateral nail border which is been ongoing for quite some time.  He denies any recent treatment for this other than he did purchase an over-the-counter Dr. Margart Sickles ingrown toenail.  He states that he thinks the nail is too far gone to be beneficial.  Areas tender with pressure.  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    Past Medical History:  Diagnosis Date   ADHD (attention deficit hyperactivity disorder)    Asthma    Eczema    Febrile seizure (HCC)    Seasonal allergies    Seizure (HCC)    Seizures (HCC)     Past Surgical History:  Procedure Laterality Date   DENTAL SURGERY  2010   OTHER SURGICAL HISTORY  2004   Broviac Catheter insertion and removal when he was an infant     Current Outpatient Medications:    carbamazepine (TEGRETOL) 200 MG tablet, Take 1 tablet (200 mg total) by mouth 2 (two) times daily., Disp: 180 tablet, Rfl: 3   triamcinolone cream (KENALOG) 0.1 %, Apply 1 Application topically 2 (two) times daily., Disp: 30 g, Rfl: 1  No Known Allergies       Objective:  Physical Exam  General: AAO x3, NAD  Dermatological: Incurvation present to left lateral nail border there is granulation tissue present along the corner of the nail that is also going underneath the toenail the nail is loose.  There is some clear drainage at the time but there is no frank purulence.  There is edema and erythema in the nail there is no ascending cellulitis.  See picture below.       Vascular: Dorsalis Pedis artery and Posterior Tibial artery pedal pulses are 2/4 bilateral with  immedate capillary fill time. There is no pain with calf compression, swelling, warmth, erythema.   Neruologic: Grossly intact via light touch bilateral.   Musculoskeletal: Tenderness left hallux toenail.  No other areas of discomfort.  Gait: Unassisted, Nonantalgic.       Assessment:   Ingrown toenail with infection left hallux     Plan:  -Treatment options discussed including all alternatives, risks, and complications -Etiology of symptoms were discussed -At this time, recommended total nail removal without chemical matricectomy to the left hallux due to infection. Risks and complications were discussed with the patient for which they understand and  verbally consent to the procedure. Under sterile conditions a total of 3 mL of a mixture of 2% lidocaine plain and 0.5% Marcaine plain was infiltrated in a hallux block fashion. Once anesthetized, the skin was prepped in sterile fashion. A tourniquet was then applied. Next the left hallux nail border was removed in total making sure that all nail borders.  Once debrided there was no further drainage or any purulence noted.  Underlying skin intact.  The area was irrigated and hemostasis was obtained.  A dry sterile dressing was applied. After application of the dressing the tourniquet was removed and there is found to be an immediate capillary refill time to the digit. The patient tolerated the  procedure well any complications. Post procedure instructions were discussed the patient for which he verbally understood. Follow-up in one week for nail check or sooner if any problems are to arise. Discussed signs/symptoms of worsening infection and directed to call the office immediately should any occur or go directly to the emergency room. In the meantime, encouraged to call the office with any questions, concerns, changes symptoms. -Keflex  Return in about 2 weeks (around 03/30/2023), or if symptoms worsen or fail to improve, for nail check.  Vivi Barrack DPM

## 2023-03-16 NOTE — Patient Instructions (Signed)

## 2023-03-27 ENCOUNTER — Ambulatory Visit (INDEPENDENT_AMBULATORY_CARE_PROVIDER_SITE_OTHER): Payer: BC Managed Care – PPO | Admitting: Family Medicine

## 2023-03-27 ENCOUNTER — Encounter (HOSPITAL_BASED_OUTPATIENT_CLINIC_OR_DEPARTMENT_OTHER): Payer: Self-pay | Admitting: Family Medicine

## 2023-03-27 VITALS — BP 130/68 | HR 98 | Ht 68.0 in | Wt 155.0 lb

## 2023-03-27 DIAGNOSIS — G40309 Generalized idiopathic epilepsy and epileptic syndromes, not intractable, without status epilepticus: Secondary | ICD-10-CM

## 2023-03-27 DIAGNOSIS — H6122 Impacted cerumen, left ear: Secondary | ICD-10-CM | POA: Diagnosis not present

## 2023-03-27 DIAGNOSIS — F902 Attention-deficit hyperactivity disorder, combined type: Secondary | ICD-10-CM | POA: Diagnosis not present

## 2023-03-27 DIAGNOSIS — Z23 Encounter for immunization: Secondary | ICD-10-CM

## 2023-03-27 NOTE — Assessment & Plan Note (Signed)
Previously, patient was referred to psychiatry for further evaluation related to underlying/history of ADHD.  It does appear that psychiatry office did attempt to contact patient, however did not receive response from patient and referral was closed.  He did also discuss with his neurologist who recommended possibly using [facility], although they are cash pay. We discussed options in office today and patient was wanting to proceed with psychiatry referral locally.  New referral placed today.  If he does not hear from their office, advised that he contact us and we can provide assistance with scheduling

## 2023-03-27 NOTE — Assessment & Plan Note (Signed)
Patient indicates that he has had some muffled hearing of left ear over the last week or so.  He has not had any associated pain.  He does indicate that he will utilize Q-tips to clean his ear although he recognizes that this is not ideal. Exam, right external auditory canal is clear, left external auditory canal with occlusion from cerumen. Discussed options, patient was amenable to proceeding with ear lavage today.  Ear lavage completed in office successfully with improvement in symptoms noted.  Discussed recommendations for avoiding use of Q-tips due to risk of cerumen impaction.  Advised on use of Debrox to help with keeping ears clean.

## 2023-03-27 NOTE — Assessment & Plan Note (Signed)
Patient continues to follow with neurology and continues with Tegretol as prescribed.  He has not had any further seizure activity since episode in January.  He is aware of the need to remain on medication, denies any issues or concerns related to medication at this time.  He is also aware of state driving laws related to seizure activity.  Given that he has not had any further seizure activity, he has been allowed to resume driving by neurology. Recommend continued follow-up with neurologist

## 2023-03-27 NOTE — Progress Notes (Signed)
    Procedures performed today:    None.  Independent interpretation of notes and tests performed by another provider:   None.  Brief History, Exam, Impression, and Recommendations:    BP 130/68 (BP Location: Right Arm, Patient Position: Sitting, Cuff Size: Normal)   Pulse 98   Ht 5\' 8"  (1.727 m)   Wt 155 lb (70.3 kg)   SpO2 100%   BMI 23.57 kg/m   Encounter for immunization -     Flu vaccine trivalent PF, 6mos and older(Flulaval,Afluria,Fluarix,Fluzone)  Generalized convulsive epilepsy [facility]) Assessment & Plan: Patient continues to follow with neurology and continues with Tegretol as prescribed.  He has not had any further seizure activity since episode in January.  He is aware of the need to remain on medication, denies any issues or concerns related to medication at this time.  He is also aware of state driving laws related to seizure activity.  Given that he has not had any further seizure activity, he has been allowed to resume driving by neurology. Recommend continued follow-up with neurologist   Attention deficit hyperactivity disorder, combined type Assessment & Plan: Previously, patient was referred to psychiatry for further evaluation related to underlying/history of ADHD.  It does appear that psychiatry office did attempt to contact patient, however did not receive response from patient and referral was closed.  He did also discuss with his neurologist who recommended possibly [facility], although they are cash pay. We discussed options in office today and patient was wanting to proceed with psychiatry referral locally.  New referral placed today.  If he does not hear from their office, advised that he contact us and we can provide assistance with scheduling  Orders: -     Ambulatory referral to Psychiatry  Impacted cerumen of left ear Assessment & Plan: Patient indicates that he has had some muffled hearing of left ear over the last week or  so.  He has not had any associated pain.  He does indicate that he will utilize Q-tips to clean his ear although he recognizes that this is not ideal. Exam, right external auditory canal is clear, left external auditory canal with occlusion from cerumen. Discussed options, patient was amenable to proceeding with ear lavage today.  Ear lavage completed in office successfully with improvement in symptoms noted.  Discussed recommendations for avoiding use of Q-tips due to risk of cerumen impaction.  Advised on use of Debrox to help with keeping ears clean.   Return in about 6 months (around 09/24/2023) for CPE with fasting labs 1 week prior.   ___________________________________________ Jerrye Seebeck de [place], MD, ABFM, CAQSM Primary Care and Sports Medicine [facility]

## 2023-03-30 ENCOUNTER — Other Ambulatory Visit: Payer: Self-pay

## 2023-03-30 ENCOUNTER — Other Ambulatory Visit (HOSPITAL_BASED_OUTPATIENT_CLINIC_OR_DEPARTMENT_OTHER): Payer: Self-pay | Admitting: Family Medicine

## 2023-03-30 MED ORDER — TRIAMCINOLONE ACETONIDE 0.1 % EX CREA
1.0000 | TOPICAL_CREAM | Freq: Two times a day (BID) | CUTANEOUS | 1 refills | Status: DC
  Filled 2023-03-30: qty 30, 15d supply, fill #0
  Filled 2023-11-20: qty 30, 15d supply, fill #1

## 2023-04-24 ENCOUNTER — Ambulatory Visit (INDEPENDENT_AMBULATORY_CARE_PROVIDER_SITE_OTHER): Payer: BC Managed Care – PPO | Admitting: Licensed Clinical Social Worker

## 2023-04-24 ENCOUNTER — Encounter (HOSPITAL_COMMUNITY): Payer: Self-pay

## 2023-04-24 DIAGNOSIS — F840 Autistic disorder: Secondary | ICD-10-CM

## 2023-04-24 DIAGNOSIS — F902 Attention-deficit hyperactivity disorder, combined type: Secondary | ICD-10-CM

## 2023-04-24 DIAGNOSIS — F4322 Adjustment disorder with anxiety: Secondary | ICD-10-CM | POA: Diagnosis not present

## 2023-04-24 NOTE — Progress Notes (Signed)
Comprehensive Clinical Assessment (CCA) Note  04/24/2023 Matthew Duncan [id number]   Visit Diagnosis: Adjustment disorder with anxious mood  Attention deficit hyperactivity disorder, combined type  Autism spectrum disorder requiring support (level 1)    Summary: Matthew "[name]" is a 22yo, Caucasian male, presenting for initial intake CCA to establish tx for continued management of stress and anxious sxs. Pt has past psych hx of ADHD and ASD, managed without medications for some time. 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 Pt reports sxs to include changes in energy/activity, changes in appetite, difficulties focusing or motivating self to complete tasks, and increased worrying. Pt reports sxs of hyperactivity and inattentiveness impacting his abilities at managing responsibilities and in turn leading to feeling stressed and overwhelmed. Pt reports hx of experiencing depressive sxs over the past 6 months due to seizure episode in early 2024, preventing him from being able to drive for 55mo, resulting in pt experiencing depressed moods due to limitations placed on pt. Pt denies SI, HI, AVH. Pt denies prior OPT services, however reports receiving medication for ADHD in earlier childhood years, believed to have been [place]. Pt would benefit from continued engagement in OPT services on bi-weekly basis in efforts to effectively manage and/or ameliorate mental health sxs.       04/24/2023    1:19 PM 03/27/2023    8:29 AM 11/14/2022    3:21 PM 08/08/2022    1:23 PM  GAD 7 : Generalized Anxiety Score  Nervous, Anxious, on Edge 0 0 0 1  Control/stop worrying 1 0 0 1  Worry too much - different things 1 0 0 1  Trouble relaxing 1 0 0 1  Restless 1 0 0 1  Easily annoyed or irritable 1 0 0 1  Afraid - awful might happen 0 0 0 1  Total GAD 7 Score 5 0 0 7  Anxiety Difficulty Somewhat difficult Not difficult at  all Not difficult at all Somewhat difficult       04/24/2023    1:17 PM 04/24/2023    1:16 PM 03/27/2023    8:29 AM 11/14/2022    3:21 PM 08/08/2022    1:23 PM  Depression screen PHQ 2/9  Decreased Interest 0 0 0 0 1  Down, Depressed, Hopeless 1 1 0 0 3  PHQ - 2 Score 1 1 0 0 4  Altered sleeping 1  0  1  Tired, decreased energy 1  0  1  Change in appetite 3  0  3  Feeling bad or failure about yourself  1  0  3  Trouble concentrating 3  0  3  Moving slowly or fidgety/restless 1  0  3  Suicidal thoughts 0  0  3  PHQ-9 Score 11  0  21  Difficult doing work/chores Somewhat difficult  Not difficult at all  Very difficult   Flowsheet Row Counselor from 04/24/2023 in [facility] at [facility] RISK CATEGORY No Risk      CCA Biopsychosocial Intake/Chief Complaint:  "I was daling with some depression but over the past couple months I've moved past some of that as I've been so busy. In childhood I took medication for ADHD. Trying to see if there's anything I can do to take care of trouble I have with focus and getting overwhelmed or stressed so much that I am not thinking straight."  Current Symptoms/Problems: Pt reports feeling overwhelmed with  level of stress experiencing right now with school, feeling increased anxiety surrounding school and work responsibilities, managing sxs of epilepsy.   Patient Reported Schizophrenia/Schizoaffective Diagnosis in Past: No   Strengths: "Creative, problem solving"  Preferences: Bi-weekly, no preference of provider.  Abilities: Creative problem solving, improv thinking   Type of Services Patient Feels are Needed: Individual Therapy   Initial Clinical Notes/Concerns: Pt is a 22yo, Caucasian male, presenting for initial intake CCA in order to establish tx for continued management of stress and anxious sxs. Pt has past psych hx of ADHD and ASD, managed without medications for some time. Pt reports sxs to include  changes in energy/activity, changes in appetite, sleep challenges, difficulties focusing or motivating self to complete tasks, and increased worrying. Pt reports sxs of hyperactivity and inattentiveness impacting his abilties at managing responsibilities and in turn leading to feeling stressed and overwhelmed.   Mental Health Symptoms Depression:   Change in energy/activity; Increase/decrease in appetite; Sleep (too much or little)   Duration of Depressive symptoms: No data recorded  Mania:   None   Anxiety:    Difficulty concentrating; Irritability; Sleep; Worrying   Psychosis:   None   Duration of Psychotic symptoms: No data recorded  Trauma:   None   Obsessions:   None   Compulsions:   None   Inattention:   Avoids/dislikes activities that require focus; Symptoms before age 46; Disorganized; Fails to pay attention/makes careless mistakes; Loses things; Forgetful; Poor follow-through on tasks   Hyperactivity/Impulsivity:   Always on the go; Feeling of restlessness; Fidgets with hands/feet; Symptoms present before age 45; Talks excessively   Oppositional/Defiant Behaviors:   None   Emotional Irregularity:   None   Other Mood/Personality Symptoms:  No data recorded   Mental Status Exam Appearance and self-care  Stature:   Average   Weight:   Average weight   Clothing:  No data recorded  Grooming:   Normal   Cosmetic use:   None   Posture/gait:   Normal   Motor activity:   Not Remarkable   Sensorium  Attention:   Normal   Concentration:   Normal   Orientation:   X5   Recall/memory:   Normal   Affect and Mood  Affect:   Appropriate   Mood:  No data recorded  Relating  Eye contact:   Normal   Facial expression:   Anxious   Attitude toward examiner:   Cooperative   Thought and Language  Speech flow:  Normal   Thought content:   Appropriate to Mood and Circumstances   Preoccupation:   None   Hallucinations:   None    Organization:  No data recorded  [employer] of Knowledge:   Average   Intelligence:   Average   Abstraction:   Normal   Judgement:   Good   Reality Testing:   Adequate   Insight:   Good   Decision Making:   Normal   Social Functioning  Social Maturity:   Responsible   Social Judgement:   Normal   Stress  Stressors:   School   Coping Ability:   Overwhelmed   Skill Deficits:   Communication; Interpersonal   Supports:   Family; Friends/Service system     Religion: Religion/Spirituality Are You A Religious Person?: No  Leisure/Recreation: Leisure / Recreation Do You Have Hobbies?: Yes Leisure and Hobbies: "Play videogames, theatre, going to see plays, hanging out with friends, music production/technology, creative writing"  Exercise/Diet: Exercise/Diet Do  You Exercise?: No Have You Gained or Lost A Significant Amount of Weight in the Past Six Months?: No Do You Follow a Special Diet?: No Do You Have Any Trouble Sleeping?: No   CCA Employment/Education Employment/Work Situation: Employment / Work Situation 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 Patient's Education Has Been Impacted by Current Illness: Yes How Does Current Illness Impact Education?: Challenges concentrating.   CCA Family/Childhood History Family and Relationship History: Family history Marital status: Single Are you sexually active?: No What is your sexual orientation?: Heterosexual Does patient have children?: No  Childhood History:  Childhood History By whom was/is the patient raised?: Mother/father and step-parent, Mother Additional childhood history information: "My dad would every now and then send stuff, [place], don't have any memories of meeting him. My stepdad has been involved since I was 4" Description of patient's relationship with caregiver when they were a child: "With mom it's been ususal parent, good; With Dad, at times it's been a bit strained as I was the passive aggressive aggressor, there were times I was trying to get out of trouble, I would lie regularly" Patient's description of current relationship with people who raised him/her: "Now it's definitely more respectful. 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 working" How were you disciplined when you got in trouble as a child/adolescent?: "Had things taken, dad would more so just sit down and talk to me" Does patient have siblings?: Yes Number of Siblings: 1 (15yo brother) Description of patient's current relationship with siblings: "Pretty good, he's never been all that talkative, but we have a good relationship" Did patient suffer any verbal/emotional/physical/sexual abuse as a child?: No Did patient suffer from severe childhood neglect?: No Has patient ever been sexually abused/assaulted/raped as an adolescent or adult?: No Was the patient ever a victim of a crime or a disaster?: No Witnessed domestic  violence?: No Has patient been affected by domestic violence as an adult?: No  CCA Substance Use Alcohol/Drug Use: Alcohol / Drug Use Prescriptions: See MAR. 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  Patient/Guardian was advised Release of Information must be obtained prior to any record release in order to collaborate their care with an outside provider. Patient/Guardian was advised if they have not already done so to contact the registration department to sign all necessary forms in order for [place] to release information regarding their care.   Consent: Patient/Guardian gives verbal consent for treatment and assignment of benefits for services provided during this visit. Patient/Guardian expressed understanding and agreed to proceed.   Leisa Lenz, LCSW

## 2023-05-08 ENCOUNTER — Ambulatory Visit (INDEPENDENT_AMBULATORY_CARE_PROVIDER_SITE_OTHER): Payer: BC Managed Care – PPO | Admitting: Licensed Clinical Social Worker

## 2023-05-08 DIAGNOSIS — F902 Attention-deficit hyperactivity disorder, combined type: Secondary | ICD-10-CM

## 2023-05-08 DIAGNOSIS — F4322 Adjustment disorder with anxiety: Secondary | ICD-10-CM | POA: Diagnosis not present

## 2023-05-08 NOTE — Progress Notes (Signed)
THERAPIST PROGRESS NOTE   Session Date: 05/08/2023  Session Time: 1053 - 1140  Participation Level: Active  Behavioral Response: Casual and Well GroomedAlertAnxious  Type of Therapy: Individual Therapy  Treatment Goals addressed: - LTG: Improve abilities at identifying and managing stressors in a more healthy manner. (Anxiety) - LTG: Remi Deter "[name]" will demonstrate increased independent task completion (ADHD) - LTG: Be able to self motivate AEB pt reports of successfully completing tasks and responsibilities 3-5 x's/week (ADHD)  ProgressTowards Goals: Progressing  Interventions: CBT, Solution Focused, and Supportive  Summary: Matthew Duncan is a 22 y.o. male who presents for follow up therapy appointment for the management of sxs related to Adjustment disorder with anxious mood and ADHD, Combined.  Patient actively engaged in today's session providing recounts of past 2 weeks, sharing specific details surrounding events that have created significant stress and anxiety for patient.  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 Pt further engaged in discussion surrounding prior hx of management of anxious sxs and stress tolerance. Pt reported recent instances of anxiety surrounding upcoming performance decreasing his frustration tolerance, reporting of having to remove self from situation to avoid explosive outbursts. Processed with pt and explored positive perspective surrounding abilities to remove self from triggering situations. Further explored pt's thoughts on alternate methods he can implement in decreasing anxious sxs, and increasing frustration tolerance. Patient engaged in session. Patient responded well to interventions. Patient continues to meet criteria for Adjustment disorder with anxious mood and ADHD, Combined. Patient will  continue to benefit from engagement in outpatient therapy due to being the least restrictive service to meet presenting needs.      05/08/2023   11:09 AM 04/24/2023    1:17 PM 04/24/2023    1:16 PM 03/27/2023    8:29 AM 11/14/2022    3:21 PM  Depression screen PHQ 2/9  Decreased Interest 0 0 0 0 0  Down, Depressed, Hopeless 0 1 1 0 0  PHQ - 2 Score 0 1 1 0 0  Altered sleeping  1  0   Tired, decreased energy  1  0   Change in appetite  3  0   Feeling bad or failure about yourself   1  0   Trouble concentrating  3  0   Moving slowly or fidgety/restless  1  0   Suicidal thoughts  0  0   PHQ-9 Score  11  0   Difficult doing work/chores  Somewhat difficult  Not difficult at all       05/08/2023   11:04 AM 04/24/2023    1:19 PM 03/27/2023    8:29 AM 11/14/2022    3:21 PM  GAD 7 : Generalized Anxiety Score  Nervous, Anxious, on Edge 1 0 0 0  Control/stop worrying 1 1 0 0  Worry too much - different things 2 1 0 0  Trouble relaxing 2 1 0 0  Restless 1 1 0 0  Easily annoyed or irritable 1 1 0 0  Afraid - awful might happen 0 0 0 0  Total GAD 7 Score 8 5 0 0  Anxiety Difficulty Somewhat difficult Somewhat difficult Not difficult at all Not difficult at all    Patient made minimal progress on identified tx goals at this time.   Suicidal/Homicidal: No  Therapist Response: Clinician utilized CBT, solution focused, and supportive reflection techniques to address pt's reported  presenting anxious sxs. Clinician actively supported pt in processing thoughts and feelings surrounding recent stressors over the past two weeks, supporting pt in exploring various perspectives on what went well over the past two weeks. Re-administered GAD-7 and PHQ-2/9, processing variances in scallings in comparison to two weeks prior, and exploration of any contributing factors impacting pt's abilities at managing anxious sxs. Therapist provided support and empathy to patient during session.  Plan: Return again in  2 weeks.  Diagnosis: Adjustment disorder with anxious mood  Attention deficit hyperactivity disorder, combined type  Collaboration of Care: Other None necessary at this time.  Patient/Guardian was advised Release of Information must be obtained prior to any record release in order to collaborate their care with an outside provider. Patient/Guardian was advised if they have not already done so to contact the registration department to sign all necessary forms in order for [place] to release information regarding their care.   Consent: Patient/Guardian gives verbal consent for treatment and assignment of benefits for services provided during this visit. Patient/Guardian expressed understanding and agreed to proceed.   Leisa Lenz, MSW, LCSW 05/08/2023,  10:54 AM

## 2023-05-14 ENCOUNTER — Ambulatory Visit: Payer: BC Managed Care – PPO | Admitting: Podiatry

## 2023-05-22 ENCOUNTER — Ambulatory Visit (INDEPENDENT_AMBULATORY_CARE_PROVIDER_SITE_OTHER): Payer: BC Managed Care – PPO | Admitting: Licensed Clinical Social Worker

## 2023-05-22 DIAGNOSIS — F902 Attention-deficit hyperactivity disorder, combined type: Secondary | ICD-10-CM | POA: Diagnosis not present

## 2023-05-22 DIAGNOSIS — F4322 Adjustment disorder with anxiety: Secondary | ICD-10-CM

## 2023-05-22 NOTE — Progress Notes (Signed)
THERAPIST PROGRESS NOTE   Session Date: 05/22/2023  Session Time: 1116 - 1200  Participation Level: Active  Behavioral Response: Casual and Well GroomedAlertAnxious  Type of Therapy: Individual Therapy  Treatment Goals addressed: - LTG: Improve abilities at identifying and managing stressors in a more healthy manner. (Anxiety) - LTG: Matthew Deter "[name]" will demonstrate increased independent task completion (ADHD) - LTG: Be able to self motivate AEB pt reports of successfully completing tasks and responsibilities 3-5 x's/week (ADHD)  ProgressTowards Goals: Progressing  Interventions: CBT, Solution Focused, and Supportive  Summary: Matthew Duncan is a 22 y.o. male who presents for follow up therapy appointment for the management of sxs related to Adjustment disorder with anxious mood and ADHD, Combined.  Patient actively engaged in session detailing recent added stress surrounding contracting respiratory illness, impacting his overall moods and requiring increased efforts to complete required daily tasks.  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  Patient actively engaged in completion of PHQ 9 and GAD 7, processing variances in scoring in relation to presents of depressive and anxious symptoms across settings over the past 2 weeks.  Patient identified one instance of significant stress in communication with father, resulting in passive SI, further processing thoughts, and feelings in relation to events, and patient's current mental status.  Further processed the patient's efforts at engaging in activities he finds enjoyable, encouraging patient to increase efforts at exploring enjoyable activities.  Patient responded well to interventions. Patient continues to meet criteria for Adjustment disorder with anxious mood and ADHD, Combined. Patient will continue to benefit from engagement in outpatient therapy  due to being the least restrictive service to meet presenting needs.      05/22/2023   11:20 AM 05/08/2023   11:09 AM 04/24/2023    1:17 PM 04/24/2023    1:16 PM 03/27/2023    8:29 AM  Depression screen PHQ 2/9  Decreased Interest 1 0 0 0 0  Down, Depressed, Hopeless 1 0 1 1 0  PHQ - 2 Score 2 0 1 1 0  Altered sleeping 0  1  0  Tired, decreased energy 2  1  0  Change in appetite 1  3  0  Feeling bad or failure about yourself  1  1  0  Trouble concentrating 1  3  0  Moving slowly or fidgety/restless 1  1  0  Suicidal thoughts 1  0  0  PHQ-9 Score 9  11  0  Difficult doing work/chores Somewhat difficult  Somewhat difficult  Not difficult at all   [employer] from 05/22/2023 in [facility] from 05/08/2023 in [facility] from 04/24/2023 in [facility] at [facility] RISK CATEGORY Moderate Risk Moderate Risk No Risk         05/22/2023   11:18 AM 05/08/2023   11:04 AM 04/24/2023    1:19 PM 03/27/2023    8:29 AM  GAD 7 : Generalized Anxiety Score  Nervous, Anxious, on Edge 2 1 0 0  Control/stop worrying 2 1 1  0  Worry too much - different things 1 2 1  0  Trouble relaxing 1 2 1  0  Restless 1 1 1  0  Easily annoyed or irritable 0 1 1 0  Afraid - awful might happen 0 0 0 0  Total GAD 7 Score 7 8 5  0  Anxiety Difficulty Somewhat difficult Somewhat difficult Somewhat difficult Not difficult at all    Patient made minimal progress on identified tx goals at this time.   Suicidal/Homicidal: No  Therapist Response: Clinician utilized CBT, solution focused, and supportive reflection techniques to address pt's reported presenting anxious sxs. 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 Re-administered PHQ9 and GAD7 in aims of monitoring reduction of depressive and anxious sxs across settings, evoking pt's thoughts and feelings surrounding variances in scaling and reported concerns. 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 Processed pt's reported instance of passive SI, assessing severity and intent determining absence of risk.  Therapist provided support and empathy to patient during session.  Plan: Return again in 2 weeks.  Diagnosis: Adjustment disorder with anxious mood  Attention deficit hyperactivity disorder, combined type  Collaboration of Care: Other None necessary at this time.  Patient/Guardian was advised Release of Information must be obtained prior to any record release in order to collaborate their care with an outside provider. Patient/Guardian was advised if they have not already done so to contact the registration department to sign all necessary forms in order for [place] to release information regarding their care.   Consent: Patient/Guardian gives verbal consent for treatment and assignment of benefits for services provided during this visit. Patient/Guardian expressed understanding and agreed to proceed.   Leisa Lenz, MSW, LCSW 05/22/2023,  11:24 AM

## 2023-06-07 ENCOUNTER — Ambulatory Visit (HOSPITAL_COMMUNITY): Payer: BC Managed Care – PPO | Admitting: Licensed Clinical Social Worker

## 2023-06-07 DIAGNOSIS — F902 Attention-deficit hyperactivity disorder, combined type: Secondary | ICD-10-CM

## 2023-06-07 DIAGNOSIS — F840 Autistic disorder: Secondary | ICD-10-CM

## 2023-06-07 DIAGNOSIS — F4322 Adjustment disorder with anxiety: Secondary | ICD-10-CM

## 2023-06-07 NOTE — Progress Notes (Signed)
THERAPIST PROGRESS NOTE   Session Date: 06/07/2023  Session Time: 1413 - 1500  Participation Level: Active  Behavioral Response: Casual and Well GroomedAlertAnxious  Type of Therapy: Individual Therapy  Treatment Goals addressed: - LTG: Improve abilities at identifying and managing stressors in a more healthy manner. (Anxiety) - LTG: Remi Deter "[name]" will demonstrate increased independent task completion (ADHD) - LTG: Be able to self motivate AEB pt reports of successfully completing tasks and responsibilities 3-5 x's/week (ADHD)  ProgressTowards Goals: Progressing  Interventions: CBT, Solution Focused, and Supportive  Summary: Matthew Duncan is a 22 y.o. male who presents for follow up therapy appointment for the management of sxs related to Adjustment disorder with anxious mood and ADHD, Combined.  Patient actively engaged in session detailing events of the past two weeks. Actively engaged in completion of PHQ9 and GAD7, further exploring thoughts surrounding reductions in severity of depressive and anxious sxs.  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 Patient responded well to interventions. Patient continues to meet criteria for Adjustment disorder with anxious mood and ADHD, Combined. Patient will continue to benefit from engagement in outpatient therapy due to being the least restrictive service to meet presenting needs.      06/07/2023    2:19 PM 05/22/2023   11:20 AM 05/08/2023   11:09 AM 04/24/2023    1:17 PM 04/24/2023     1:16 PM  Depression screen PHQ 2/9  Decreased Interest 0 1 0 0 0  Down, Depressed, Hopeless 0 1 0 1 1  PHQ - 2 Score 0 2 0 1 1  Altered sleeping 0 0  1   Tired, decreased energy 1 2  1    Change in appetite 1 1  3    Feeling bad or failure about yourself  0 1  1   Trouble concentrating 1 1  3    Moving slowly or fidgety/restless 0 1  1   Suicidal thoughts 0 1  0   PHQ-9 Score 3 9  11    Difficult doing work/chores Not difficult at all Somewhat difficult  Somewhat difficult    Flowsheet Row Counselor from 05/22/2023 in [facility] from 05/08/2023 in [facility] from 04/24/2023 in [facility] at [facility] RISK CATEGORY Moderate Risk Moderate Risk No Risk         06/07/2023    2:17 PM 05/22/2023   11:18 AM 05/08/2023   11:04 AM 04/24/2023    1:19 PM  GAD 7 : Generalized Anxiety Score  Nervous, Anxious, on Edge 0 2 1 0  Control/stop worrying 0 2 1 1   Worry too much - different things 1 1 2 1   Trouble relaxing 1 1 2 1   Restless 0 1 1 1   Easily annoyed or irritable 0 0 1 1  Afraid - awful might happen 0 0 0 0  Total GAD 7 Score 2 7 8 5   Anxiety Difficulty Not difficult at all Somewhat difficult Somewhat difficult Somewhat difficult    Patient made minimal progress on identified  tx goals at this time.   Suicidal/Homicidal: No  Therapist Response: Clinician utilized CBT, solution focused, and supportive reflection techniques to address pt's reported presenting anxious sxs. Clinician actively supported pt in processing recent events, and explored pt's thoughts and feelings surrounding noted reduction in depressive and anxious sxs. Further evoked pt's thoughts surrounding contributing factors to reduction in depressive and anxious sxs. Therapist provided support and empathy to patient during session.  Plan: Return again in 2-3  weeks.  Diagnosis: Adjustment disorder with anxious mood  Attention deficit hyperactivity disorder, combined type  Autism spectrum disorder requiring support (level 1)  Collaboration of Care: Other None necessary at this time.  Patient/Guardian was advised Release of Information must be obtained prior to any record release in order to collaborate their care with an outside provider. Patient/Guardian was advised if they have not already done so to contact the registration department to sign all necessary forms in order for [place] to release information regarding their care.   Consent: Patient/Guardian gives verbal consent for treatment and assignment of benefits for services provided during this visit. Patient/Guardian expressed understanding and agreed to proceed.   Leisa Lenz, MSW, LCSW 06/07/2023,  2:26 PM

## 2023-06-14 ENCOUNTER — Ambulatory Visit: Payer: BC Managed Care – PPO | Admitting: Podiatry

## 2023-07-05 ENCOUNTER — Other Ambulatory Visit: Payer: Self-pay | Admitting: Neurology

## 2023-07-05 DIAGNOSIS — G40309 Generalized idiopathic epilepsy and epileptic syndromes, not intractable, without status epilepticus: Secondary | ICD-10-CM

## 2023-07-12 ENCOUNTER — Ambulatory Visit (INDEPENDENT_AMBULATORY_CARE_PROVIDER_SITE_OTHER): Payer: BC Managed Care – PPO | Admitting: Licensed Clinical Social Worker

## 2023-07-12 DIAGNOSIS — F902 Attention-deficit hyperactivity disorder, combined type: Secondary | ICD-10-CM | POA: Diagnosis not present

## 2023-07-12 DIAGNOSIS — F4322 Adjustment disorder with anxiety: Secondary | ICD-10-CM | POA: Diagnosis not present
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
## 2023-08-07 ENCOUNTER — Ambulatory Visit (INDEPENDENT_AMBULATORY_CARE_PROVIDER_SITE_OTHER): Payer: BC Managed Care – PPO | Admitting: Licensed Clinical Social Worker

## 2023-08-07 DIAGNOSIS — F4322 Adjustment disorder with anxiety: Secondary | ICD-10-CM | POA: Diagnosis not present

## 2023-08-07 DIAGNOSIS — F902 Attention-deficit hyperactivity disorder, combined type: Secondary | ICD-10-CM

## 2023-08-07 NOTE — Progress Notes (Signed)
 THERAPIST PROGRESS NOTE   Session Date: 08/07/2023  Session Time: 1603 - 1653  Participation Level: Active  Behavioral Response: CasualAlertEuthymic  Type of Therapy: Individual Therapy  Treatment Goals addressed: - LTG: Improve abilities at identifying and managing stressors in a more healthy manner. (Anxiety) - LTG: Nicolaas Savo will demonstrate increased independent task completion (ADHD) - LTG: Be able to self motivate AEB pt reports of successfully completing tasks and responsibilities 3-5 x's/week (ADHD)  ProgressTowards Goals: Progressing  Interventions: CBT, Solution Focused, and Supportive  Summary: Bring is a 23 y.o. male who presents for follow up therapy appointment for the management of sxs related to Adjustment disorder with anxious mood and ADHD, Combined.  Patient actively engaged in session, presenting in pleasant moods, with congruent affect throughout. Actively engaged in re-administering PHQ-9 and GAD-7, further engaging pt in exploration of observed increase in sxs and stressors.  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 Pt further detailed ways in which he proved to navigate challenges and efforts towards navigating poor time management. - Additionally detailed having reduced stress surrounding finances due to having worked more and feeling financial stress proving more manageable, with current stress proving to be ensuring he is getting enough rest - Engaged in Challenging Anxious Thoughts activity, identifying anxiety provoking events, exploring worst, best, and likely outcomes, and navigating irrational and rational thoughts.  Patient responded well to interventions. Patient continues to meet criteria for Adjustment  disorder with anxious mood and ADHD, Combined. Patient will continue to benefit from engagement in outpatient therapy due to being the least restrictive service to meet presenting needs.      08/07/2023    4:09 PM 07/12/2023   11:22 AM 06/07/2023    2:19 PM 05/22/2023   11:20 AM 05/08/2023   11:09 AM  Depression screen PHQ 2/9  Decreased Interest 1 1 0 1 0  Down, Depressed, Hopeless 0 0 0 1 0  PHQ - 2 Score 1 1 0 2 0  Altered sleeping 2 2 0 0   Tired, decreased energy 2 1 1 2    Change in appetite 3 1 1 1    Feeling bad or failure about yourself  1 0 0 1   Trouble concentrating 0 1 1 1    Moving slowly or fidgety/restless 0 0 0 1   Suicidal thoughts 0 0 0 1   PHQ-9 Score 9 6 3 9    Difficult doing work/chores Somewhat difficult Somewhat difficult Not difficult at all Somewhat difficult        08/07/2023    4:06 PM 07/12/2023   11:20 AM 06/07/2023    2:17 PM 05/22/2023   11:18 AM  GAD 7 : Generalized Anxiety Score  Nervous, Anxious, on Edge 1 1 0 2  Control/stop worrying 1 0 0 2  Worry too much - different things 1 0 1 1  Trouble relaxing 2 2 1 1   Restless 1 1 0 1  Easily annoyed or irritable 0 1 0 0  Afraid - awful might happen 0 0 0 0  Total GAD 7 Score 6 5 2 7   Anxiety Difficulty Somewhat difficult Not difficult at all Not difficult at all Somewhat difficult    Suicidal/Homicidal: No  Therapist Response: Clinician utilized CBT, solution focused, and supportive reflection interventions to  address reported presenting anxious sxs. Actively engaged pt in check-in, assessing presenting moods and affect. Re-administered PHQ-9 and GAD-7, further engaging pt in review of presenting sxs, exploring pt's perspectives of depressive sxs being secondary to anxiety and stress. Actively listened to pt's recounts of recent events, eliciting thoughts, feelings and perspectives surrounding recent stress, presenting sxs, and efforts towards navigating such. Clinician actively supported pt in engaging in  Challenging Anxious Thoughts activity, supporting pt in processing rational and irrational thoughts triggered by anxiety.  Clinician reassessed severity of depressive and anxious sxs, and presence of any safety concerns. Therapist provided support and empathy to patient during session.  Plan: Return again in 3 weeks.  Diagnosis: Adjustment disorder with anxious mood  Attention deficit hyperactivity disorder, combined type  Collaboration of Care: Other None necessary at this time.  Patient/Guardian was advised Release of Information must be obtained prior to any record release in order to collaborate their care with an outside provider. Patient/Guardian was advised if they have not already done so to contact the registration department to sign all necessary forms in order for us  to release information regarding their care.   Consent: Patient/Guardian gives verbal consent for treatment and assignment of benefits for services provided during this visit. Patient/Guardian expressed understanding and agreed to proceed.   [name], MSW, LCSW 08/07/2023,  4:12 PM

## 2023-08-30 ENCOUNTER — Ambulatory Visit (HOSPITAL_COMMUNITY): Payer: BC Managed Care – PPO | Admitting: Licensed Clinical Social Worker

## 2023-09-05 ENCOUNTER — Ambulatory Visit (INDEPENDENT_AMBULATORY_CARE_PROVIDER_SITE_OTHER): Payer: BC Managed Care – PPO | Admitting: Licensed Clinical Social Worker

## 2023-09-05 DIAGNOSIS — Z91199 Patient's noncompliance with other medical treatment and regimen due to unspecified reason: Secondary | ICD-10-CM

## 2023-09-05 NOTE — Progress Notes (Signed)
 THERAPIST PROGRESS NOTE   Session Date: 09/05/2023  Session Time: 1000  Patient no-showed today's appointment; appointment was for follow up therapy, provider notified for review of record, patient agrees to reschedule missed appointment.   Leisa Lenz, MSW, LCSW 09/05/2023,  9:48 AM

## 2023-09-06 ENCOUNTER — Encounter: Payer: Self-pay | Admitting: Neurology

## 2023-09-13 ENCOUNTER — Ambulatory Visit (HOSPITAL_COMMUNITY): Admitting: Licensed Clinical Social Worker

## 2023-09-13 DIAGNOSIS — F902 Attention-deficit hyperactivity disorder, combined type: Secondary | ICD-10-CM | POA: Diagnosis not present

## 2023-09-13 DIAGNOSIS — F840 Autistic disorder: Secondary | ICD-10-CM | POA: Diagnosis not present

## 2023-09-13 DIAGNOSIS — F4322 Adjustment disorder with anxiety: Secondary | ICD-10-CM

## 2023-09-13 NOTE — Progress Notes (Signed)
 THERAPIST PROGRESS NOTE   Session Date: 09/13/2023  Session Time: 1020 - 1105  Participation Level: Active  Behavioral Response: CasualAlertEuthymic  Type of Therapy: Individual Therapy  Treatment Goals addressed: - LTG: Improve abilities at identifying and managing stressors in a more healthy manner. (Anxiety) (MET) - LTG: Irbin "[name]" will demonstrate increased independent task completion (ADHD) - LTG: Be able to self motivate AEB pt reports of successfully completing tasks and responsibilities 3-5 x's/week (ADHD)  ProgressTowards Goals: Progressing  Interventions: CBT, Solution Focused, and Supportive  Summary: Matthew Duncan is a 23 y.o. male who presents for follow up therapy appointment for the management of sxs related to Adjustment disorder with anxious mood and ADHD, Combined.  Patient actively engaged in session, presenting in pleasant moods, with congruent affect throughout.   Patient openly engaged in introductory check-in, sharing of feeling somewhat tired as a result of increased tasks and responsibilities as of late.  Patient openly engaged in reassessment of presenting depressive and anxious symptoms observed over the past 2 weeks via PHQ-9 and GAD-7, further detailing too little sleep and tiredness being solely related to increased tasks/requirements and preparing for transitioning to [employer] following this semester.  Patient identified continued observations of anxious symptoms, further detailing thoughts and feelings regarding symptoms in relation to stress surrounding transition to a [employer].  Processed the comparisons between varying types of stress, i.e. good versus bad, acknowledging good stress surrounded [employer] admission plans and patient's needs to finalize all steps for admission.  Patient actively engaged in review of individualized tx goals, processing each individual goal, elaborating on thoughts and perspectives in relation to overall progress, and identifying beliefs  of having met anx. Goal further sharing of having increased overall awareness of stressors and able to identify them, and in relation to ADHD goals expressing beliefs of continuing to be improving abilities to self motivate and begin necessary tasks in timely manners before deadlines.  Patient responded well to interventions. Patient continues to meet criteria for Adjustment disorder with anxious mood and ADHD, Combined. Patient will continue to benefit from engagement in outpatient therapy due to being the least restrictive service to meet presenting needs.      09/13/2023   10:25 AM 08/07/2023    4:09 PM 07/12/2023   11:22 AM 06/07/2023    2:19 PM 05/22/2023   11:20 AM  Depression screen PHQ 2/9  Decreased Interest 0 1 1 0 1  Down, Depressed, Hopeless 0 0 0 0 1  PHQ - 2 Score 0 1 1 0 2  Altered sleeping 0 2 2 0 0  Tired, decreased energy 0 2 1 1 2   Change in appetite 0 3 1 1 1   Feeling bad or failure about yourself  0 1 0 0 1  Trouble concentrating 0 0 1 1 1   Moving slowly or fidgety/restless 0 0 0 0 1  Suicidal thoughts 0 0 0 0 1  PHQ-9 Score 0 9 6 3 9   Difficult doing work/chores  Somewhat difficult Somewhat difficult Not difficult at all Somewhat difficult       09/13/2023   10:23 AM 08/07/2023    4:06 PM 07/12/2023   11:20 AM 06/07/2023    2:17 PM  GAD 7 : Generalized Anxiety Score  Nervous, Anxious, on Edge 1 1 1  0  Control/stop worrying 1 1 0 0  Worry too much - different things 1 1 0 1  Trouble relaxing 1 2 2 1   Restless 1 1 1  0  Easily annoyed or  irritable 0 0 1 0  Afraid - awful might happen 0 0 0 0  Total GAD 7 Score 5 6 5 2   Anxiety Difficulty Somewhat difficult Somewhat difficult Not difficult at all Not difficult at all    Suicidal/Homicidal: No  Therapist Response: Clinician utilized CBT, solution focused, and supportive reflection interventions to address reported presenting anxious sxs. Actively engaged pt in check-in, assessing presenting moods and  affect.  Actively engage patient in introductory check-in, assessing presenting moods and affect, actively listening to patient's reports of current presenting challenges.  Actively engage patient in reassessment of depressive and anxious symptoms experienced over the past 2 weeks via PHQ-9 and GAD-7, further engaging patient in reflection of current scores, exploring patient's thoughts and perspectives in relation to continued presenting anxious symptoms, further eliciting patient's perspectives in regards to contributing factors.  Actively listened to patient's recounts of recent events since previous session, processing reflection of events with patient, validating patient's expressed thoughts, feelings and perspectives in regards to increased stress surrounding transition to [place].  Further supported patient in processing stressors surrounded potential changes in availability of transportation and possible options for employment closer to campus.  Clinician reassessed severity of depressive and anxious sxs, and presence of any safety concerns. Therapist provided support and empathy to patient during session.  Plan: Return again in 3 weeks.  Diagnosis: Adjustment disorder with anxious mood  Attention deficit hyperactivity disorder, combined type  Autism spectrum disorder requiring support (level 1)  Collaboration of Care: Other None necessary at this time.  Patient/Guardian was advised Release of Information must be obtained prior to any record release in order to collaborate their care with an outside provider. Patient/Guardian was advised if they have not already done so to contact the registration department to sign all necessary forms in order for [place] to release information regarding their care.   Consent: Patient/Guardian gives verbal consent for treatment and assignment of benefits for services provided during this visit. Patient/Guardian expressed understanding and agreed to proceed.    Leisa Lenz, MSW, LCSW 09/13/2023,  10:27 AM

## 2023-09-17 ENCOUNTER — Other Ambulatory Visit (HOSPITAL_BASED_OUTPATIENT_CLINIC_OR_DEPARTMENT_OTHER): Payer: BC Managed Care – PPO

## 2023-09-17 ENCOUNTER — Other Ambulatory Visit (HOSPITAL_BASED_OUTPATIENT_CLINIC_OR_DEPARTMENT_OTHER): Payer: Self-pay | Admitting: *Deleted

## 2023-09-17 DIAGNOSIS — Z0000 Encounter for general adult medical examination without abnormal findings: Secondary | ICD-10-CM | POA: Diagnosis not present

## 2023-09-18 LAB — CBC WITH DIFFERENTIAL/PLATELET
Basophils Absolute: 0 10*3/uL (ref 0.0–0.2)
Basos: 0 %
EOS (ABSOLUTE): 0.2 10*3/uL (ref 0.0–0.4)
Eos: 5 %
Hematocrit: 49 % (ref 37.5–51.0)
Hemoglobin: 16.8 g/dL (ref 13.0–17.7)
Immature Grans (Abs): 0 10*3/uL (ref 0.0–0.1)
Immature Granulocytes: 0 %
Lymphocytes Absolute: 1.6 10*3/uL (ref 0.7–3.1)
Lymphs: 31 %
MCH: 32.7 pg (ref 26.6–33.0)
MCHC: 34.3 g/dL (ref 31.5–35.7)
MCV: 96 fL (ref 79–97)
Monocytes Absolute: 0.5 10*3/uL (ref 0.1–0.9)
Monocytes: 9 %
Neutrophils Absolute: 2.8 10*3/uL (ref 1.4–7.0)
Neutrophils: 55 %
Platelets: 221 10*3/uL (ref 150–450)
RBC: 5.13 x10E6/uL (ref 4.14–5.80)
RDW: 11.9 % (ref 11.6–15.4)
WBC: 5.1 10*3/uL (ref 3.4–10.8)

## 2023-09-18 LAB — COMPREHENSIVE METABOLIC PANEL
ALT: 20 IU/L (ref 0–44)
AST: 18 IU/L (ref 0–40)
Albumin: 4.7 g/dL (ref 4.3–5.2)
Alkaline Phosphatase: 73 IU/L (ref 51–125)
BUN/Creatinine Ratio: 13 (ref 9–20)
BUN: 12 mg/dL (ref 6–20)
Bilirubin Total: 0.3 mg/dL (ref 0.0–1.2)
CO2: 23 mmol/L (ref 20–29)
Calcium: 9.7 mg/dL (ref 8.7–10.2)
Chloride: 104 mmol/L (ref 96–106)
Creatinine, Ser: 0.91 mg/dL (ref 0.76–1.27)
Globulin, Total: 2.3 g/dL (ref 1.5–4.5)
Glucose: 87 mg/dL (ref 70–99)
Potassium: 4.5 mmol/L (ref 3.5–5.2)
Sodium: 142 mmol/L (ref 134–144)
Total Protein: 7 g/dL (ref 6.0–8.5)
eGFR: 124 mL/min/{1.73_m2} (ref >59)

## 2023-09-18 LAB — HEMOGLOBIN A1C
Est. average glucose Bld gHb Est-mCnc: 111 mg/dL
Hgb A1c MFr Bld: 5.5 % (ref 4.8–5.6)

## 2023-09-18 LAB — LIPID PANEL
Chol/HDL Ratio: 3 ratio (ref 0.0–5.0)
Cholesterol, Total: 143 mg/dL (ref 100–199)
HDL: 47 mg/dL (ref >39)
LDL Chol Calc (NIH): 86 mg/dL (ref 0–99)
Triglycerides: 46 mg/dL (ref 0–149)
VLDL Cholesterol Cal: 10 mg/dL (ref 5–40)

## 2023-09-18 LAB — TSH: TSH: 1.95 u[IU]/mL (ref 0.450–4.500)

## 2023-09-21 ENCOUNTER — Ambulatory Visit (HOSPITAL_COMMUNITY): Payer: BC Managed Care – PPO | Admitting: Licensed Clinical Social Worker

## 2023-09-24 ENCOUNTER — Ambulatory Visit (INDEPENDENT_AMBULATORY_CARE_PROVIDER_SITE_OTHER): Payer: BC Managed Care – PPO | Admitting: Family Medicine

## 2023-09-24 ENCOUNTER — Encounter (HOSPITAL_BASED_OUTPATIENT_CLINIC_OR_DEPARTMENT_OTHER): Payer: Self-pay | Admitting: Family Medicine

## 2023-09-24 VITALS — BP 130/78 | HR 81 | Ht 68.0 in | Wt 159.2 lb

## 2023-09-24 DIAGNOSIS — Z0000 Encounter for general adult medical examination without abnormal findings: Secondary | ICD-10-CM | POA: Insufficient documentation

## 2023-09-24 NOTE — Progress Notes (Signed)
 Subjective:    CC: Annual Physical Exam  HPI: Matthew Duncan is a 23 y.o. presenting for annual physical  I reviewed the past medical history, family history, social history, surgical history, and allergies today and no changes were needed.  Please see the problem list section below in epic for further details.  Past Medical History: Past Medical History:  Diagnosis Date   ADHD (attention deficit hyperactivity disorder)    Asthma    Eczema    Febrile seizure (HCC)    Seasonal allergies    Seizure (HCC)    Seizures (HCC)    Past Surgical History: Past Surgical History:  Procedure Laterality Date   DENTAL SURGERY  2010   OTHER SURGICAL HISTORY  2004   Broviac Catheter insertion and removal when he was an infant   Social History: Social History   Socioeconomic History   Marital status: Single    Spouse name: Not on file   Number of children: 0   Years of education: Not on file   Highest education level: Some college, no degree  Occupational History   Not on file  Tobacco Use   Smoking status: Never    Passive exposure: Never   Smokeless tobacco: Never  Vaping Use   Vaping status: Never Used  Substance and Sexual Activity   Alcohol use: No   Drug use: No   Sexual activity: Never  Other Topics Concern   Not on file  Social History Narrative   Banyan will attend [employer] in the fall   He lives with both parents and his brother.   He enjoys reading, playing videogames, chess   Social Drivers of Health   Financial Resource Strain: Low Risk  (03/27/2023)   Overall Financial Resource Strain (CARDIA)    Difficulty of Paying Living Expenses: Not hard at all  Food Insecurity: No Food Insecurity (03/27/2023)   Hunger Vital Sign    Worried About Running Out of Food in the Last Year: Never true    Ran Out of Food in the Last Year: Never true  Transportation Needs: No Transportation Needs (03/27/2023)   PRAPARE - Administrator, Civil Service (Medical): No     Lack of Transportation (Non-Medical): No  Physical Activity: Inactive (03/27/2023)   Exercise Vital Sign    Days of Exercise per Week: 0 days    Minutes of Exercise per Session: 0 min  Stress: No Stress Concern Present (03/27/2023)   [employer] of Occupational Health - Occupational Stress Questionnaire    Feeling of Stress : Not at all  Social Connections: Moderately Isolated (03/27/2023)   Social Connection and Isolation Panel [NHANES]    Frequency of Communication with Friends and Family: More than three times a week    Frequency of Social Gatherings with Friends and Family: More than three times a week    Attends Religious Services: Never    Database administrator or Organizations: Yes    Attends Engineer, structural: More than 4 times per year    Marital Status: Never married   Family History: Family History  Problem Relation Age of Onset   Diabetes Maternal Grandfather    Heart Problems Maternal Grandfather        Heart Disease   Seizures Other        Maternal 2nd Cousin   Allergies: No Known Allergies Medications: See med rec.  Review of Systems: No headache, visual changes, nausea, vomiting, diarrhea, constipation, dizziness, abdominal pain, skin rash,  fevers, chills, night sweats, swollen lymph nodes, weight loss, chest pain, body aches, joint swelling, muscle aches, shortness of breath, mood changes, visual or auditory hallucinations.  Objective:    BP 130/78 (BP Location: Right Arm, Patient Position: Sitting, Cuff Size: Normal)   Pulse 81   Ht 5\' 8"  (1.727 m)   Wt 159 lb 3.2 oz (72.2 kg)   SpO2 99%   BMI 24.21 kg/m   General: Well Developed, well nourished, and in no acute distress.  Neuro: Alert and oriented x3, extra-ocular muscles intact, sensation grossly intact. Cranial nerves II through XII are intact, motor, sensory, and coordinative functions are all intact. HEENT: Normocephalic, atraumatic, pupils equal round reactive to light, neck supple,  no masses, no lymphadenopathy, thyroid nonpalpable. Oropharynx, nasopharynx, external ear canals are unremarkable. Skin: Warm and dry, no rashes noted.  Cardiac: Regular rate and rhythm, no murmurs rubs or gallops.  Respiratory: Clear to auscultation bilaterally. Not using accessory muscles, speaking in full sentences.  Abdominal: Soft, nontender, nondistended, positive bowel sounds, no masses, no organomegaly.  Musculoskeletal: Shoulder, elbow, wrist, hip, knee, ankle stable, and with full range of motion.  Impression and Recommendations:    Wellness examination Assessment & Plan: Routine HCM labs reviewed. HCM reviewed/discussed. Anticipatory guidance regarding healthy weight, lifestyle and choices given. Recommend healthy diet.  Recommend approximately 150 minutes/week of moderate intensity exercise Recommend regular dental and vision exams Always use seatbelt/lap and shoulder restraints Recommend using smoke alarms and checking batteries at least twice a year Recommend using sunscreen when outside Discussed immunization recommendations   Return in about 1 year (around 09/23/2024) for CPE. Can also schedule appointment in the next 2 to 3 months as patient would also like to touch base prior to school beginning in the fall.   ___________________________________________ Keiley Levey de [place], MD, ABFM, CAQSM Primary Care and Sports Medicine [facility]

## 2023-09-24 NOTE — Patient Instructions (Signed)
  Medication Instructions:  Your physician recommends that you continue on your current medications as directed. Please refer to the Current Medication list given to you today. --If you need a refill on any your medications before your next appointment, please call your pharmacy first. If no refills are authorized on file call the office.-- Lab Work: Your physician has recommended that you have lab work today: no If you have labs (blood work) drawn today and your tests are completely normal, you will receive your results via MyChart message OR a phone call from our staff.  Please ensure you check your voicemail in the event that you authorized detailed messages to be left on a delegated number. If you have any lab test that is abnormal or we need to change your treatment, we will call you to review the results.  Referrals/Procedures/Imaging: no  Follow-Up: Your next appointment:   Your physician recommends that you schedule a follow-up appointment in: June/July prior to school beginning and then in 1 year for physical with Dr. de Peru  You will receive a text message or e-mail with a link to a survey about your care and experience with [place] today! We would greatly appreciate your feedback!   Thanks for letting us be apart of your health journey!!  Primary Care and Sports Medicine   Dr. Ceasar Mons [place]   We encourage you to activate your patient portal called "MyChart".  Sign up information is provided on this After Visit Summary.  MyChart is used to connect with patients for Virtual Visits (Telemedicine).  Patients are able to view lab/test results, encounter notes, upcoming appointments, etc.  Non-urgent messages can be sent to your provider as well. To learn more about what you can do with MyChart, please visit --  [email or web address].

## 2023-09-24 NOTE — Assessment & Plan Note (Signed)

## 2023-09-28 ENCOUNTER — Ambulatory Visit (INDEPENDENT_AMBULATORY_CARE_PROVIDER_SITE_OTHER): Admitting: Licensed Clinical Social Worker

## 2023-09-28 DIAGNOSIS — Z91199 Patient's noncompliance with other medical treatment and regimen due to unspecified reason: Secondary | ICD-10-CM

## 2023-09-28 NOTE — Progress Notes (Signed)
 THERAPIST PROGRESS NOTE   Session Date: 09/28/2023  Session Time: 0900  Patient no-showed today's appointment; appointment was for follow up therapy, provider notified for review of record. LCSW reviewed chart, noting of today's missed appt being second no-show, first being 09/05/23. Will explore scheduling challenges with pt at next visit.  Leisa Lenz, MSW, LCSW 09/28/2023,  9:17 AM

## 2023-09-29 ENCOUNTER — Encounter: Payer: Self-pay | Admitting: Neurology

## 2023-10-01 ENCOUNTER — Encounter: Payer: Self-pay | Admitting: Neurology

## 2023-10-12 ENCOUNTER — Ambulatory Visit (HOSPITAL_COMMUNITY): Payer: BC Managed Care – PPO | Admitting: Licensed Clinical Social Worker

## 2023-10-26 ENCOUNTER — Encounter (HOSPITAL_COMMUNITY): Payer: Self-pay | Admitting: Licensed Clinical Social Worker

## 2023-10-26 ENCOUNTER — Ambulatory Visit (INDEPENDENT_AMBULATORY_CARE_PROVIDER_SITE_OTHER): Payer: BC Managed Care – PPO | Admitting: Licensed Clinical Social Worker

## 2023-10-26 ENCOUNTER — Encounter (HOSPITAL_COMMUNITY): Payer: Self-pay

## 2023-10-26 DIAGNOSIS — Z91199 Patient's noncompliance with other medical treatment and regimen due to unspecified reason: Secondary | ICD-10-CM

## 2023-10-26 NOTE — Progress Notes (Signed)
 THERAPIST PROGRESS NOTE   Session Date: 10/26/2023  Session Time: 1100 - 1115  [place]    Clinician attempted to connect with patient for scheduled appointment via Caregility video, sending text request x3 with no response.     Attempt 1: Text: 1105   Attempt 2: Text: 1110    Attempt 3: Text: 1112   Disconnected video visit at:  1115     Per [place] policy, after multiple attempts to reach patient unsuccessfully at appointed time, visit will be coded as a no show.  LCSW reviewed chart, noting of today's missed appt being second consecutive, and third total no-show, first being 09/05/23, and second on 09/28/23.   Pt will be released from practice due to inconsistent engagement in tx, and advised to secure services with student services via [facility] or [employer] if continued tx is desired. Office will Proofreader.    Patsi Boots, MSW, LCSW 10/26/2023,  11:15 AM

## 2023-11-21 ENCOUNTER — Other Ambulatory Visit (HOSPITAL_COMMUNITY): Payer: Self-pay

## 2023-11-21 ENCOUNTER — Encounter: Payer: Self-pay | Admitting: Pharmacist

## 2023-11-21 ENCOUNTER — Other Ambulatory Visit: Payer: Self-pay

## 2023-11-23 ENCOUNTER — Other Ambulatory Visit: Payer: Self-pay

## 2023-12-27 ENCOUNTER — Telehealth: Payer: Self-pay | Admitting: Neurology

## 2023-12-27 NOTE — Telephone Encounter (Signed)
 Patient reschedule appointment due to scheduling conflict.

## 2024-01-07 ENCOUNTER — Encounter (HOSPITAL_BASED_OUTPATIENT_CLINIC_OR_DEPARTMENT_OTHER): Payer: Self-pay | Admitting: Family Medicine

## 2024-01-07 ENCOUNTER — Ambulatory Visit (INDEPENDENT_AMBULATORY_CARE_PROVIDER_SITE_OTHER): Admitting: Family Medicine

## 2024-01-07 VITALS — BP 126/81 | HR 79 | Ht 68.0 in | Wt 156.7 lb

## 2024-01-07 DIAGNOSIS — G40309 Generalized idiopathic epilepsy and epileptic syndromes, not intractable, without status epilepticus: Secondary | ICD-10-CM | POA: Diagnosis not present

## 2024-01-07 DIAGNOSIS — J3089 Other allergic rhinitis: Secondary | ICD-10-CM | POA: Diagnosis not present

## 2024-01-07 MED ORDER — TRIAMCINOLONE ACETONIDE 0.1 % EX CREA: 1.0000 | TOPICAL_CREAM | Freq: Two times a day (BID) | CUTANEOUS | 1 refills | Status: AC

## 2024-01-07 NOTE — Assessment & Plan Note (Signed)
 Patient continues to follow with neurology and continues with Tegretol  as prescribed.  He has not had any further seizure activity.  He is aware of the need to remain on medication, denies any issues or concerns related to medication at this time.  He is also aware of state driving laws related to seizure activity. Recommend continued follow-up with neurologist

## 2024-01-07 NOTE — Patient Instructions (Signed)

## 2024-01-07 NOTE — Assessment & Plan Note (Signed)
 Notes that where he works he has had some exposure to dust, has been having some symptoms with this and has been using OTC Claritin. Symptoms fairly mild, mostly mild cough Discussed considerations. Can continue with OTC antihistamine. Discussed additional measures such as nasal steroid spray, etc.

## 2024-01-07 NOTE — Progress Notes (Signed)
    Procedures performed today:    None.  Independent interpretation of notes and tests performed by another provider:   None.  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  He has not had any further seizure activity.  He is aware of the need to remain on medication, denies any issues or concerns related to medication at this time.  He is also aware of state driving laws related to seizure activity. Recommend continued follow-up with neurologist   Environmental and seasonal allergies Assessment & Plan: Notes that where he works he has had some exposure to dust, has been having some symptoms with this and has been using OTC Claritin. Symptoms fairly mild, mostly mild cough Discussed considerations. Can continue with OTC antihistamine. Discussed additional measures such as nasal steroid spray, etc.    Other orders -     Triamcinolone  Acetonide; Apply 1 Application topically 2 (two) times daily.  Dispense: 30 g; Refill: 1  Return if symptoms worsen or fail to improve.   ___________________________________________ Sair Faulcon de [place], MD, ABFM, CAQSM Primary Care and Sports Medicine [facility]

## 2024-01-26 ENCOUNTER — Other Ambulatory Visit: Payer: Self-pay | Admitting: Neurology

## 2024-01-26 DIAGNOSIS — G40309 Generalized idiopathic epilepsy and epileptic syndromes, not intractable, without status epilepticus: Secondary | ICD-10-CM

## 2024-02-04 ENCOUNTER — Ambulatory Visit: Payer: BC Managed Care – PPO | Admitting: Adult Health

## 2024-02-13 ENCOUNTER — Ambulatory Visit
Admission: RE | Admit: 2024-02-13 | Discharge: 2024-02-13 | Disposition: A | Discharge status: Home / Self Care | Source: Ambulatory Visit | Attending: Family Medicine | Admitting: Family Medicine

## 2024-02-13 VITALS — BP 134/80 | HR 83 | Temp 98.7°F | Resp 18

## 2024-02-13 DIAGNOSIS — H6123 Impacted cerumen, bilateral: Secondary | ICD-10-CM | POA: Diagnosis not present

## 2024-02-13 MED ORDER — OFLOXACIN 0.3 % OT SOLN: 5.0000 [drp] | Freq: Two times a day (BID) | OTIC | 0 refills | Status: AC

## 2024-02-13 NOTE — ED Triage Notes (Signed)
 Pt present with c/o lt ear fullness x three days. Pt states it is hard to hear and has tried to wash out his ear at home.

## 2024-02-13 NOTE — Discharge Instructions (Signed)
 Start ofloxacin  antibiotic eardrops twice daily for 5 days to the left ear.  Keep water out of the ear until treatment is complete.  Avoid Q-tips and earbuds.  Follow-up with your PCP if your symptoms do not improve.  Please go to the ER for any worsening symptoms.  Hope you feel better soon!

## 2024-02-13 NOTE — ED Provider Notes (Signed)
 [facility]    CSN: [id number] Arrival date & time: 02/13/24  1259      History   Chief Complaint Chief Complaint  Patient presents with   Ear Fullness    Need an ear wash due to loss of hearing - Entered by patient    HPI Matthew Duncan is a 23 y.o. male presents for ear fullness.  Reports 3 days of left ear/muffled hearing.  Denies any ear pain, drainage, fever/chills, URI symptoms.  Denies any excessive water in the ear such as swimming.  States he had similar symptoms in the past secondary to earwax buildup and has had them flushed out.  He tried some home flushing kits without improvement.  No other concerns at this time.   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 Authorizing Provider  ofloxacin  (FLOXIN ) 0.3 % OTIC solution Place 5 drops into the left ear 2 (two) times daily for 5 days. 02/13/24 02/18/24 Yes Arsema Tusing, Jodi R, NP   carbamazepine  (TEGRETOL ) 200 MG tablet TAKE 1 TABLET BY MOUTH TWICE A DAY 01/28/24   Gregg Lek, MD  triamcinolone  cream (KENALOG ) 0.1 % Apply 1 Application topically 2 (two) times daily. 01/07/24   de [place], Raymond J, MD    Family History Family History  Problem Relation Age of Onset   Diabetes Maternal Grandfather    Heart Problems Maternal Grandfather        Heart Disease   Seizures Other        Maternal 2nd Cousin    Social History Social History   Tobacco Use   Smoking status: Never    Passive exposure: Never   Smokeless tobacco: Never  Vaping Use   Vaping status: Never Used  Substance Use Topics   Alcohol use: No   Drug use: No     Allergies   Patient has no known allergies.   Review of Systems Review of Systems  HENT:         Ear fullness     Physical Exam Triage Vital Signs ED Triage Vitals [02/13/24 1305]  Encounter Vitals Group     BP 134/80     Girls Systolic BP Percentile      Girls Diastolic BP Percentile      Boys Systolic BP Percentile      Boys Diastolic BP Percentile      Pulse Rate 83  Resp 18     Temp 98.7 F (37.1 C)     Temp Source Oral     SpO2 97 %     Weight      Height      Head Circumference      Peak Flow      Pain Score 0     Pain Loc      Pain Education      Exclude from Growth Chart    No data found.  Updated Vital Signs BP 134/80 (BP Location: Right Arm)   Pulse 83   Temp 98.7 F (37.1 C) (Oral)   Resp 18   SpO2 97%   Visual Acuity Right Eye Distance:   Left Eye Distance:   Bilateral Distance:    Right Eye Near:   Left Eye Near:    Bilateral Near:     Physical Exam Vitals and nursing note reviewed.  Constitutional:      General: He is not in acute distress.    Appearance: Normal appearance. He is not ill-appearing.  HENT:     Head: Normocephalic and atraumatic.     Right Ear: There is impacted cerumen.     Left Ear: There is impacted cerumen.     Ears:     Comments: After irrigation right  canal and TM within normal limits.  Left canal with some remaining cerumen,  partial TM visible and is within normal limits.  Small area of mild bleeding of canal secondary to irrigation.  No swelling or drainage of your canal. Eyes:     Pupils: Pupils are equal, round, and reactive to light.  Cardiovascular:     Rate and Rhythm: Normal rate.  Pulmonary:     Effort: Pulmonary effort is normal.  Skin:    General: Skin is warm and dry.  Neurological:     General: No focal deficit present.     Mental Status: He is alert and oriented to person, place, and time.  Psychiatric:        Mood and Affect: Mood normal.        Behavior: Behavior normal.      UC Treatments / Results  Labs (all labs ordered are listed, but only abnormal results are displayed) Labs Reviewed - No data to display  EKG   Radiology No results found.  Procedures Procedures (including critical care time)  Medications Ordered in UC Medications - No data to display  Initial Impression / Assessment and Plan / UC Course  I have reviewed the triage vital signs and the nursing notes.  Pertinent labs & imaging results that were available during my care of the patient were reviewed by me and considered in my medical decision making (see chart for details).     Reviewed exam and symptoms with patient.  He tolerated irrigation well.  Stopped additional irrigation to left ear due to small amount of bleeding.  Patient does report hearing is greatly improved.  Will do 5 days of ofloxacin  antibiotic drops to prevent otitis externa.  Advised to keep water out of the ear for that timeframe.  No Q-tips or earbuds.  PCP follow-up as symptoms do not improve.  ER precautions reviewed. Final Clinical Impressions(s) / UC Diagnoses   Final diagnoses:  Bilateral impacted cerumen     Discharge Instructions      Start ofloxacin  antibiotic eardrops twice daily for 5 days to the left ear.  Keep water out of the ear until  treatment is  complete.  Avoid Q-tips and earbuds.  Follow-up with your PCP if your symptoms do not improve.  Please go to the ER for any worsening symptoms.  Hope you feel better soon!     ED Prescriptions     Medication Sig Dispense Auth. Provider   ofloxacin  (FLOXIN ) 0.3 % OTIC solution Place 5 drops into the left ear 2 (two) times daily for 5 days. 5 mL Caasi Giglia, Jodi R, NP      PDMP not reviewed this encounter.   Loreda Myla SAUNDERS, NP 02/13/24 1326

## 2024-03-24 ENCOUNTER — Telehealth: Payer: Self-pay | Admitting: Neurology

## 2024-03-24 NOTE — Telephone Encounter (Signed)
 error

## 2024-03-25 DIAGNOSIS — Z0289 Encounter for other administrative examinations: Secondary | ICD-10-CM

## 2024-04-01 ENCOUNTER — Encounter: Payer: Self-pay | Admitting: Neurology

## 2024-04-01 ENCOUNTER — Telehealth (INDEPENDENT_AMBULATORY_CARE_PROVIDER_SITE_OTHER): Admitting: Neurology

## 2024-04-01 DIAGNOSIS — G40309 Generalized idiopathic epilepsy and epileptic syndromes, not intractable, without status epilepticus: Secondary | ICD-10-CM

## 2024-04-01 NOTE — Progress Notes (Signed)
 [facility]  PATIENT: Matthew Duncan DOB: 2000/12/25  REQUESTING CLINICIAN: de [place], Raymond J, MD HISTORY FROM: Patient and mother  REASON FOR VISIT: Epilepsy, here to establish care   HISTORICAL  CHIEF COMPLAINT:  Chief Complaint  Patient presents with   Seizures    Follow up     INTERVAL HISTORY 04/01/2024 Patient was called today for video visit.  Last visit was in August 2024.  Since then he has been doing well, has not had any seizure or seizure activity.  He tells me he is on the Tegretol  200 mg twice daily denies any side effects.  No other complaint no other concerns.   INTERVAL HISTORY 02/13/2023:  Patient presents today for follow-up, he is accompanied by her mother.  Last visit was in February, at that time, plan was to continue patient on Tegretol  200 mg twice daily.  He continues to do well, his routine EEG was negative for any acute abnormality.  He reports having a job at [employer], overall he is doing well.  He is still in school.  He is ready to go back to driving.  No other complaints, no other concerns.   HISTORY OF PRESENT ILLNESS:  This is a 23 year old gentleman with past medical history ADHD, seizure disorder who is presenting to establish care.  History mainly obtained by mother.  Per mom seizure started at the age of 26 days old.  She was nursing Matthew Duncan and his whole body went limp, then he started having some shaking.  He was admitted to the hospital had extensive workup but they could not figure out why he was having seizures.  He was having seizures until 78-month-old then seizure got better and the meds were weaned at the age of 4.  At the age of 7 he did have a febrile seizure they did not restart him on medication but at the age of 68 he did have a seizure at school.  Then he started seeing Dr. Susen and put on Tegretol .  While he is on Tegretol  he has been doing well but every time that he weaned the Tegretol  he will have a  breakthrough seizure.  This summer while going for summer camp he stopped the medication and did not tell his parents until January 26 when he did have a seizure in his sleep.  At that time, he was restarted back on Tegretol  200 mg twice daily.  With the Tegretol , he denies any side effect from the medication.  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    Handedness: Right   Onset: Since the age of 40 days old   Seizure Type: Generalized convulsion  Current frequency: Last episode in Jul 28 2022  Any injuries from seizures: Tongue biting   Seizure risk factors: None reported   Previous ASMs: Phenobarb, Tegetrol, Dilantin   Currenty ASMs: Carbamazepine  200 mg twice daily   ASMs side effects: Denies   Brain Images: reported as normal   Previous EEGs: Normal routine    OTHER MEDICAL CONDITIONS: ADHD   REVIEW OF SYSTEMS: Full 14 system review of systems performed and negative with exception of: As noted in the HPI   ALLERGIES: No Known Allergies  HOME MEDICATIONS: Outpatient Medications Prior to Visit  Medication Sig Dispense Refill   carbamazepine  (TEGRETOL ) 200 MG tablet TAKE 1 TABLET BY MOUTH TWICE A DAY 180 tablet 1   triamcinolone  cream (  KENALOG ) 0.1 % Apply 1 Application topically 2 (two) times daily. 30 g 1   No facility-administered medications prior to visit.    PAST MEDICAL HISTORY: Past Medical History:  Diagnosis Date   ADHD (attention deficit hyperactivity disorder)    Asthma    Eczema    Febrile seizure (HCC)    Seasonal allergies    Seizure (HCC)    Seizures (HCC)     PAST SURGICAL HISTORY: Past Surgical History:  Procedure Laterality Date   DENTAL SURGERY  2010   OTHER SURGICAL HISTORY  2004   Broviac Catheter insertion and removal when he was an infant    FAMILY HISTORY: Family History  Problem Relation Age of Onset   Diabetes Maternal Grandfather     Heart Problems Maternal Grandfather        Heart Disease   Seizures Other        Maternal 2nd Cousin    SOCIAL HISTORY: Social History   Socioeconomic History   Marital status: Single    Spouse name: Not on file   Number of children: 0   Years of education: Not on file   Highest education level: Some college, no degree  Occupational History   Not on file  Tobacco Use   Smoking status: Never    Passive exposure: Never   Smokeless tobacco: Never  Vaping Use   Vaping status: Never Used  Substance and Sexual Activity   Alcohol use: No   Drug use: No   Sexual activity: Never  Other Topics Concern   Not on file  Social History Narrative   Yitzchak will attend [employer] in the fall   He lives with both parents and his brother.   He enjoys reading, playing videogames, chess   Social Drivers of Health   Financial Resource Strain: Low Risk  (03/27/2023)   Overall Financial Resource Strain (CARDIA)    Difficulty of Paying Living Expenses: Not hard at all  Food Insecurity: No Food Insecurity (03/27/2023)   Hunger Vital Sign    Worried About Running Out of Food in the Last Year: Never true    Ran Out of Food in the Last Year: Never true  Transportation Needs: No Transportation Needs (03/27/2023)   PRAPARE - Administrator, Civil Service (Medical): No    Lack of Transportation (Non-Medical): No  Physical Activity: Inactive (03/27/2023)   Exercise Vital Sign    Days of Exercise per Week: 0 days    Minutes of Exercise per Session: 0 min  Stress: No Stress Concern Present (03/27/2023)   [employer] of Occupational Health - Occupational Stress Questionnaire    Feeling of Stress : Not at all  Social Connections: Moderately Isolated (03/27/2023)   Social Connection and Isolation Panel    Frequency of Communication with Friends and Family: More than three times a week    Frequency of Social Gatherings with Friends and Family: More than three times a week    Attends Religious  Services: Never    Database administrator or Organizations: Yes    Attends Engineer, structural: More than 4 times per year    Marital Status: Never married  Intimate Partner Violence: Not At Risk (03/27/2023)   Humiliation, Afraid, Rape, and Kick questionnaire    Fear of Current or Ex-Partner: No    Emotionally Abused: No    Physically Abused: No    Sexually Abused: No    PHYSICAL EXAM  GENERAL EXAM/CONSTITUTIONAL: Vitals:  There  were no vitals filed for this visit.  There is no height or weight on file to calculate BMI. Wt Readings from Last 3 Encounters:  01/07/24 156 lb 11.2 oz (71.1 kg)  09/24/23 159 lb 3.2 oz (72.2 kg)  03/27/23 155 lb (70.3 kg)   Patient is in no distress; well developed, nourished and groomed; neck is supple  MUSCULOSKELETAL: Gait, strength, tone, movements noted in Neurologic exam below  NEUROLOGIC: MENTAL STATUS:      No data to display         awake, alert, oriented to person, place and time recent and remote memory intact normal attention and concentration language fluent, comprehension intact, naming intact fund of knowledge appropriate  CRANIAL NERVE:  2nd, 3rd, 4th, 6th - Visual fields full to confrontation, extraocular muscles intact, no nystagmus 5th - facial sensation symmetric 7th - facial strength symmetric 8th - hearing intact 9th - palate elevates symmetrically, uvula midline 11th - shoulder shrug symmetric 12th - tongue protrusion midline  DIAGNOSTIC DATA (LABS, IMAGING, TESTING) - I reviewed patient records, labs, notes, testing and imaging myself where available.  Lab Results  Component Value Date   WBC 5.1 09/17/2023   HGB 16.8 09/17/2023   HCT 49.0 09/17/2023   MCV 96 09/17/2023   PLT 221 09/17/2023      Component Value Date/Time   NA 142 09/17/2023 0949   K 4.5 09/17/2023 0949   CL 104 09/17/2023 0949   CO2 23 09/17/2023 0949   GLUCOSE 87 09/17/2023 0949   GLUCOSE 92 08/20/2019 1014   BUN 12  09/17/2023 0949   CREATININE 0.91 09/17/2023 0949   CALCIUM 9.7 09/17/2023 0949   PROT 7.0 09/17/2023 0949   ALBUMIN 4.7 09/17/2023 0949   AST 18 09/17/2023 0949   ALT 20 09/17/2023 0949   ALKPHOS 73 09/17/2023 0949   BILITOT 0.3 09/17/2023 0949   GFRNONAA NOT CALCULATED 08/20/2019 1014   GFRAA NOT CALCULATED 08/20/2019 1014   Lab Results  Component Value Date   CHOL 143 09/17/2023   HDL 47 09/17/2023   LDLCALC 86 09/17/2023   TRIG 46 09/17/2023   Lab Results  Component Value Date   HGBA1C 5.5 09/17/2023   No results found for: [id number] Lab Results  Component Value Date   TSH 1.950 09/17/2023     Routine EEG 09/14/2015 This EEG is normal during awake and sleep states. Please note that normal EEG does not exclude epilepsy, clinical correlation is indicated.   Routine EEG 08/31/2022 Normal     ASSESSMENT AND PLAN  23 y.o. year old male  with ADHD and seizure disorder who is presenting  for follow up.  He is doing well on Tegretol  200 mg twice daily.  Last seizure was in January 2024.  Plan will be to continue patient on Tegretol  200 mg twice daily, will complete his DMV paperwork and I will see him in a year for follow-up or sooner if worse.  Advised him to do blood work at his next PCP visit.   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)      Patient Instructions  Continue with Tegretol  200 mg twice daily Continue to follow with PCP PCP visit please complete Tegretol  level with BMP. Return in 1 year or sooner   Per [place]  DMV statutes, patients with seizures are not allowed to drive until they have been seizure-free for six months.  Other recommendations include using caution when using heavy equipment or power tools. Avoid working on ladders or at  heights. Take showers instead of baths.  Do not swim alone.  Ensure the water temperature is not too high on the home water heater. Do not go swimming alone. Do not lock yourself in a room alone (i.e. bathroom).  When caring for infants or small children, sit down when holding, feeding, or changing them to minimize risk of injury to the child in the event you have a seizure. Maintain good sleep hygiene. Avoid alcohol.  Also recommend adequate sleep, hydration, good diet and minimize stress.   During the Seizure  - First, ensure adequate ventilation and place patients on the floor on their left side  Loosen clothing around the neck and ensure the airway is patent. If the patient is clenching the teeth, do not force the mouth open with any object as this can cause severe damage - Remove all items from the surrounding that can be hazardous. The patient may be oblivious to what's happening and may not even know what he or she is doing. If the patient is confused and wandering, either gently guide him/her away and block access to outside areas - Reassure the individual and be comforting - Call 911. In most cases, the seizure ends before EMS arrives. However, there are cases when seizures may last over 3 to 5 minutes. Or the individual may have developed breathing difficulties or severe injuries. If a pregnant patient or a person with diabetes develops a seizure, it is prudent to call an ambulance. - Finally, if the patient does not regain full consciousness, then call EMS. Most patients will remain confused for about 45 to 90 minutes after a seizure, so you must use judgment in calling for help. - Avoid restraints but make sure the patient is in a bed with padded side rails - Place the individual in a lateral position with the neck slightly flexed; this will help the saliva drain from the mouth and prevent the tongue from falling backward - Remove all nearby furniture and other hazards from the area - Provide verbal assurance as the individual is regaining consciousness - Provide the patient with privacy if possible - Call for help and start treatment as ordered by the caregiver   After the Seizure (Postictal  Stage)  After a seizure, most patients experience confusion, fatigue, muscle pain and/or a headache. Thus, one should permit the individual to sleep. For the next few days, reassurance is essential. Being calm and helping reorient the person is also of importance.  Most seizures are painless and end spontaneously. Seizures are not harmful to others but can lead to complications such as stress on the lungs, brain and the heart. Individuals with prior lung problems may develop labored breathing and respiratory distress.     No orders of the defined types were placed in this encounter.   No orders of the defined types were placed in this encounter.   Return in about 1 year (around 04/01/2025).  Virtual Visit via Video Note  I connected with  Jayson Dalton on 04/01/24 at  1:00 PM EDT by a video enabled telemedicine application and verified that I am speaking with the correct person using two identifiers.  Location: Patient: home Provider: [facility] Office   I discussed the limitations of evaluation and management by telemedicine and the availability of in person appointments. The patient expressed understanding and agreed to proceed.   I discussed the assessment and treatment plan with the patient. The patient was provided an opportunity to ask questions and all were answered.  The patient agreed with the plan and demonstrated an understanding of the instructions.   The patient was advised to call back or seek an in-person evaluation if the symptoms worsen or if the condition fails to improve as anticipated.  I provided 15 minutes of non-face-to-face time during this encounter.   I have spent a total of 20 minutes dedicated to this patient today, preparing to see patient, performing a medically appropriate examination and evaluation, ordering tests and/or medications and procedures, and counseling and educating the patient/family/caregiver; independently interpreting result and communicating  results to the family/patient/caregiver; and documenting clinical information in the electronic medical record.   Pastor Falling, MD 04/01/2024, 1:09 PM  [facility] [address] [place], [place] [address] [phone]

## 2024-04-01 NOTE — Patient Instructions (Signed)
 Continue with Tegretol  200 mg twice daily Continue to follow with PCP PCP visit please complete Tegretol  level with BMP. Return in 1 year or sooner

## 2024-04-03 ENCOUNTER — Telehealth: Payer: Self-pay | Admitting: *Deleted

## 2024-04-03 NOTE — Telephone Encounter (Signed)
 Pt dmv form faxed on 04/03/2024

## 2024-04-15 ENCOUNTER — Telehealth (HOSPITAL_BASED_OUTPATIENT_CLINIC_OR_DEPARTMENT_OTHER): Payer: Self-pay | Admitting: *Deleted

## 2024-04-15 NOTE — Telephone Encounter (Signed)
 Copied from CRM [id number]. Topic: Medical Record Request - Records Request >> Apr 15, 2024  2:43 PM Delon DASEN wrote: Reason for CRM: Anabel with [facility] DMV calling about medical report- she states the pcp was supposed to fill out part of the paperwork, page 2, fax back to [facility]-  [phone]  ask for medical review board

## 2024-04-15 NOTE — Telephone Encounter (Signed)
 Please put page 2 in de [place] box for review

## 2024-04-15 NOTE — Telephone Encounter (Signed)
 Please have patient bring form back to office and we will place in de [place] box

## 2024-04-16 ENCOUNTER — Encounter (HOSPITAL_BASED_OUTPATIENT_CLINIC_OR_DEPARTMENT_OTHER): Payer: Self-pay | Admitting: *Deleted

## 2024-04-28 ENCOUNTER — Telehealth: Admitting: Neurology

## 2024-05-20 NOTE — Telephone Encounter (Signed)
 I reached out to [facility] to see what was needed and they stated that page 2 was not complete and needed signatures.  I reached out to the neurology office and informed them of this information and they stated that they were able to pull the form and complete it and resend.

## 2024-05-21 ENCOUNTER — Telehealth: Payer: Self-pay | Admitting: *Deleted

## 2024-05-21 NOTE — Telephone Encounter (Signed)
 Pt dmv faxed on 05/21/2024

## 2024-05-28 ENCOUNTER — Ambulatory Visit: Admitting: Adult Health

## 2024-06-09 ENCOUNTER — Encounter (HOSPITAL_BASED_OUTPATIENT_CLINIC_OR_DEPARTMENT_OTHER): Payer: Self-pay | Admitting: Family Medicine

## 2024-09-24 ENCOUNTER — Encounter (HOSPITAL_BASED_OUTPATIENT_CLINIC_OR_DEPARTMENT_OTHER): Admitting: Family Medicine

## 2025-01-14 ENCOUNTER — Ambulatory Visit: Admitting: Adult Health
